# Patient Record
Sex: Female | Born: 1985 | Race: Black or African American | Hispanic: No | Marital: Single | Smoking: Never smoker
Health system: Southern US, Community
[De-identification: ages and names within clinical notes are randomized; demographics above are authoritative.]

## PROBLEM LIST (undated history)

## (undated) ENCOUNTER — Inpatient Hospital Stay (HOSPITAL_COMMUNITY): Payer: Self-pay

## (undated) DIAGNOSIS — G43909 Migraine, unspecified, not intractable, without status migrainosus: Secondary | ICD-10-CM

## (undated) DIAGNOSIS — R112 Nausea with vomiting, unspecified: Secondary | ICD-10-CM

## (undated) DIAGNOSIS — K219 Gastro-esophageal reflux disease without esophagitis: Secondary | ICD-10-CM

## (undated) DIAGNOSIS — O99119 Other diseases of the blood and blood-forming organs and certain disorders involving the immune mechanism complicating pregnancy, unspecified trimester: Secondary | ICD-10-CM

## (undated) DIAGNOSIS — D696 Thrombocytopenia, unspecified: Secondary | ICD-10-CM

## (undated) DIAGNOSIS — Z9889 Other specified postprocedural states: Secondary | ICD-10-CM

## (undated) HISTORY — DX: Other diseases of the blood and blood-forming organs and certain disorders involving the immune mechanism complicating pregnancy, unspecified trimester: O99.119

## (undated) HISTORY — PX: WISDOM TOOTH EXTRACTION: SHX21

## (undated) HISTORY — DX: Thrombocytopenia, unspecified: D69.6

## (undated) HISTORY — PX: OTHER SURGICAL HISTORY: SHX169

## (undated) HISTORY — DX: Migraine, unspecified, not intractable, without status migrainosus: G43.909

## (undated) HISTORY — DX: Other specified postprocedural states: Z98.890

## (undated) HISTORY — DX: Nausea with vomiting, unspecified: R11.2

---
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
## 2009-05-29 LAB — CONVERTED CEMR LAB: Pap Smear: NORMAL

## 2009-07-19 ENCOUNTER — Ambulatory Visit: Payer: Self-pay | Admitting: Family Medicine

## 2009-09-26 ENCOUNTER — Ambulatory Visit: Payer: Self-pay | Admitting: Family Medicine

## 2009-09-26 DIAGNOSIS — L708 Other acne: Secondary | ICD-10-CM | POA: Insufficient documentation

## 2009-09-26 DIAGNOSIS — J45909 Unspecified asthma, uncomplicated: Secondary | ICD-10-CM | POA: Insufficient documentation

## 2009-09-26 DIAGNOSIS — N760 Acute vaginitis: Secondary | ICD-10-CM | POA: Insufficient documentation

## 2009-09-26 DIAGNOSIS — G43909 Migraine, unspecified, not intractable, without status migrainosus: Secondary | ICD-10-CM | POA: Insufficient documentation

## 2009-09-26 HISTORY — DX: Other acne: L70.8

## 2009-09-26 LAB — CONVERTED CEMR LAB

## 2009-09-27 LAB — CONVERTED CEMR LAB
ALT: 12 units/L (ref 0–35)
AST: 17 units/L (ref 0–37)
Albumin: 4 g/dL (ref 3.5–5.2)
Alkaline Phosphatase: 36 units/L — ABNORMAL LOW (ref 39–117)
BUN: 14 mg/dL (ref 6–23)
Bilirubin, Direct: 0.2 mg/dL (ref 0.0–0.3)
CO2: 30 meq/L (ref 19–32)
Calcium: 9.2 mg/dL (ref 8.4–10.5)
Chloride: 108 meq/L (ref 96–112)
Cholesterol: 177 mg/dL (ref 0–200)
Creatinine, Ser: 0.9 mg/dL (ref 0.4–1.2)
GFR calc non Af Amer: 93.37 mL/min (ref >60)
Glucose, Bld: 79 mg/dL (ref 70–99)
HDL: 64.3 mg/dL (ref >39.00)
LDL Cholesterol: 102 mg/dL — ABNORMAL HIGH (ref 0–99)
Potassium: 4.2 meq/L (ref 3.5–5.1)
Sodium: 142 meq/L (ref 135–145)
Total Bilirubin: 1 mg/dL (ref 0.3–1.2)
Total CHOL/HDL Ratio: 3
Total Protein: 6.9 g/dL (ref 6.0–8.3)
Triglycerides: 55 mg/dL (ref 0.0–149.0)
VLDL: 11 mg/dL (ref 0.0–40.0)

## 2009-12-04 ENCOUNTER — Telehealth: Payer: Self-pay | Admitting: Family Medicine

## 2009-12-12 ENCOUNTER — Telehealth: Payer: Self-pay | Admitting: Family Medicine

## 2010-01-27 ENCOUNTER — Ambulatory Visit: Payer: Self-pay | Admitting: Family Medicine

## 2010-01-27 DIAGNOSIS — J029 Acute pharyngitis, unspecified: Secondary | ICD-10-CM

## 2010-05-13 NOTE — Progress Notes (Signed)
Summary: requests flagyl  Phone Note Call from Patient Call back at Home Phone [phone]   Caller: Patient Summary of Call: Pt is asking for flagyl to be called to [facility] on [address].  She say she has frequent bacterial vaginal infections and that Dr. Dayton Martes had told her she could have this called in without having to be seen. Initial call taken by: Lowella Petties CMA,  December 04, 2009 3:27 PM    Prescriptions: METROGEL-VAGINAL 0.75 % GEL (METRONIDAZOLE) 1 applicator full PV at bedtime x 5 days  #1 x 0   Entered and Authorized by:   Ruthe Mannan MD   Signed by:   Ruthe Mannan MD on 12/05/2009   Method used:   Electronically to        [facility] [phone]* (retail)       [address]       [place], [place]  [address]       Ph: [phone]       Fax: [phone]   RxID:   [id number]

## 2010-05-13 NOTE — Assessment & Plan Note (Signed)
Summary: ? MONO   Vital Signs:  Patient profile:   25 year old female Height:      76 inches Weight:      218 pounds BMI:     26.63 Temp:     98.2 degrees F oral Pulse rate:   84 / minute Pulse rhythm:   regular BP sitting:   130 / 82  (left arm) Cuff size:   regular  Vitals Entered By: Linde Gillis CMA Duncan Dull) (January 27, 2010 9:52 AM) CC: ? Mono   History of Present Illness: 25 yo here for ? mono.  Had sore throat a few days ago.  Went to Urgent care, told nothing was wrong but she wanted to make sure she didn't have mono. thought she might have mono because her stomach hurt a little, that has resolved. No fevers, chills, nausea or vomiting. No changes in stools. Throat feels better.  did have a runny nose that has resolved.  Migraines- occuring less often.  Taking NSAIDs when the occur.  Cannot tolerate Topamax.  Current Medications (verified): 1)  Imitrex 25 Mg Tabs (Sumatriptan Succinate) .Marland Kitchen.. 1 Tab By Mouth At Onset of Headache, May Repeat in 1 Hour.  Allergies: 1)  ! Pcn 2)  ! 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 ENT:  Denies difficulty swallowing. Resp:  Denies cough.  Physical Exam  General:  Well-developed,well-nourished,in no acute distress; alert,appropriate and cooperative throughout examination Mouth:  good dentition.  pharyngeal erythema and tonsil hypertropied.   Lungs:  Normal respiratory effort, chest expands symmetrically. Lungs are clear to  auscultation, no crackles or wheezes. Heart:  Normal rate and regular rhythm. S1 and S2 normal without gallop, murmur, click, rub or other extra sounds. Psych:  Cognition and judgment appear intact. Alert and cooperative with normal attention span and concentration. No apparent delusions, illusions, hallucinations   Impression & Recommendations:  Problem # 1:  SORE THROAT (ICD-462) Assessment New Reassurance provided.  Physical exam and history reassuring.  LIkely viral, continue supportive care.  Problem # 2:  MIGRAINE HEADACHE (ICD-346.90) Assessment: Unchanged Will try Imitrex for abortive therapy.  Follow up in 1-2 months. Her updated medication list for this problem includes:    Imitrex 25 Mg Tabs (Sumatriptan succinate) .Marland Kitchen... 1 tab by mouth at onset of headache, may repeat in 1 hour.  Complete Medication List: 1)  Imitrex 25 Mg Tabs (Sumatriptan succinate) .Marland Kitchen.. 1 tab by mouth at onset of headache, may repeat in 1 hour. Prescriptions: IMITREX 25 MG TABS (SUMATRIPTAN SUCCINATE) 1 tab by mouth at onset of headache, may repeat in 1 hour.  #20 x 0   Entered and Authorized by:   Ruthe Mannan MD   Signed by:   Ruthe Mannan MD on 01/27/2010   Method used:   Electronically to        [facility] [address] [phone]* (retail)       [address] [place], [place]  [address]       Ph: [phone]       Fax:  [phone]   RxID:   [id number]    Orders Added: 1)  Est. Patient Level IV [85277]    Prior Medications (reviewed today): None Current Allergies (reviewed today): ! PCN ! AMOXICILLIN

## 2010-05-13 NOTE — Assessment & Plan Note (Signed)
Summary: NEW PATIENT/[facility]   Vital Signs:  Patient profile:   25 year old female Height:      76 inches Weight:      216.38 pounds BMI:     26.43 Temp:     98.4 degrees F oral Pulse rate:   80 / minute Pulse rhythm:   regular BP sitting:   110 / 84  (left arm) Cuff size:   regular  Vitals Entered By: Linde Gillis CMA Duncan Dull) (September 26, 2009 11:03 AM) CC: new patient   History of Present Illness: 25 yo here to establish care.  ?possible allergy to condoms - has been sexually active with current partner for several months.  Has noticed that her vagina feels very irritated and inflamed after intercourse, seems to be worse with certain brands of condoms.  No using any lubricants, different soaps, or lotions.  Never has any associated SOB or wheezing.  recurrent BV- diagnosed with it at least 4 times per year for several years.  Vulvular irritation and discharge.  Usually takes by mouth flagyl.  Exercise induced asthma- has an alubeterol inhaler that she uses as needed.  Has not needed it in months but always carries it with her.  Never hospitalized for asthma.  Migraine headahces- was having 3 per month, [facility]  Placed on Topomax but she though it made her have strange thoughts and fatigue.  Discovered her triggers, such as salty food and dehyration and since then has 1 migraine every couple of months.  They are always associated with photophobia and vomiting.  Takes Tylenol for them.  Acne- has had issues with recurrent cystic acne.  Never tried anything for it.  Mainly on face and upper back.  Well woman- Sexually active with one partner.  Has Pap smears yearly at GYN.  Preventive Screening-Counseling & Management  Alcohol-Tobacco     Smoking Status: never  Caffeine-Diet-Exercise     Does Patient Exercise: yes  Current Medications (verified): 1)  Yaz 3-0.02 Mg  Tabs (Drospirenone-Ethinyl Estradiol) .Marland Kitchen.. 1 Tab By Mouth Daily 2)  Metrogel-Vaginal  0.75 % Gel (Metronidazole) .Marland Kitchen.. 1 Applicator Full Pv At Bedtime X 5 Days  Allergies (verified): 1)  ! Pcn 2)  ! 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 Eyes:  Denies blurring. ENT:  Denies difficulty swallowing. CV:  Denies chest pain or discomfort and shortness of breath with exertion. Resp:  Denies shortness of breath. GI:  Denies abdominal pain and change in bowel habits. GU:  Complains of discharge; denies dysuria. MS:  Denies joint pain, joint redness, and joint swelling. Derm:  Denies rash. Neuro:  Denies headaches. Psych:  Denies anxiety and depression. Endo:  Denies cold intolerance and heat intolerance. Heme:  Denies abnormal bruising. Allergy:  Denies seasonal allergies.  Physical Exam  General:  Well-developed,well-nourished,in no acute distress; alert,appropriate and cooperative throughout examination Head:  normocephalic, atraumatic, no abnormalities observed, and  no abnormalities palpated.   Eyes:  vision grossly intact, pupils equal, pupils round, and pupils reactive to light.   Ears:  R ear normal and L ear normal.   Nose:  no external deformity, nose piercing noted, and no external erythema.   Mouth:  good dentition.   Lungs:  Normal respiratory effort, chest expands symmetrically. Lungs are clear to auscultation, no  crackles or wheezes. Heart:  Normal rate and regular rhythm. S1 and S2 normal without gallop, murmur, click, rub or other extra sounds. Abdomen:  Bowel sounds positive,abdomen soft and non-tender without masses, organomegaly or hernias noted. Msk:  No deformity or scoliosis noted of thoracic or lumbar spine.   Extremities:  No clubbing, cyanosis, edema, or deformity noted with normal full range of motion of all joints.   Neurologic:  alert & oriented X3.   Skin:  mild cystic acne on face Psych:  Cognition and judgment appear intact. Alert and cooperative with normal attention span and concentration. No apparent delusions, illusions, hallucinations   Impression & Recommendations:  Problem # 1:  VAGINITIS, BACTERIAL, RECURRENT (ICD-616.10) Assessment New Pt did not want to have wet prep today, has had multiple recently.  Will try topical metrogel and discussed repeating wet prep and STD testing if no improvement of symptoms. Her updated medication list for this problem includes:    Metrogel-vaginal 0.75 % Gel (Metronidazole) .Marland Kitchen... 1 applicator full pv at bedtime x 5 days  Problem # 2:  MIGRAINE HEADACHE (ICD-346.90) Assessment: Unchanged Appears stable with lifestyle changes.  Problem # 3:  ? of PERSONAL HISTORY OF ALLERGY TO LATEX (ICD-V15.07) Assessment: New Discussed trying OCPs since she does trust her parter and both were tested for STDs a few months ago.  Start Yaz today, continue condoms for several weeks after starting OCPs.  Problem # 4:  SCREENING EXAMINATION FOR VENEREAL DISEASE (ICD-V74.5) Assessment: Unchanged Recheck HIV and RPR per pt request today. Orders: Venipuncture (11914) T-HIV Antibody  (Reflex) ([id number]) T-RPR (Syphilis) ([id number]) Specimen Handling (95284)  Problem # 5:  ASTHMA (ICD-493.90) Assessment: Unchanged Stable with as needed albuterol.  Problem # 6:  ACNE, MILD (ICD-706.1) Assessment: New Will hopefully improve with OCPs.  Complete  Medication List: 1)  Yaz 3-0.02 Mg Tabs (Drospirenone-ethinyl estradiol) .Marland Kitchen.. 1 tab by mouth daily 2)  Metrogel-vaginal 0.75 % Gel (Metronidazole) .Marland Kitchen.. 1 applicator full pv at bedtime x 5 days  Other Orders: TLB-Lipid Panel (80061-LIPID) TLB-BMP (Basic Metabolic Panel-BMET) (80048-METABOL) TLB-Hepatic/Liver Function Pnl (80076-HEPATIC) Prescriptions: METROGEL-VAGINAL 0.75 % GEL (METRONIDAZOLE) 1 applicator full PV at bedtime x 5 days  #1 x 0   Entered and Authorized by:   Ruthe Mannan MD   Signed by:   Ruthe Mannan MD on 09/26/2009   Method used:   Print then Give to Patient   RxID:   [id number] YAZ 3-0.02 MG  TABS (DROSPIRENONE-ETHINYL ESTRADIOL) 1 tab by mouth daily  #1 x 11   Entered and Authorized by:   Ruthe Mannan MD   Signed by:   Ruthe Mannan MD on 09/26/2009   Method used:   Print then Give to Patient   RxID:   [id number]   Prior Medications (reviewed today): None Current Allergies (reviewed today): ! PCN ! AMOXICILLIN  PAP Result Date:  05/29/2009 PAP Result:  historical-normal PAP Next Due:  1 yr Chlamydia Result Date:  09/26/2009 Chlamydia Result:  historical- normal

## 2010-05-13 NOTE — Progress Notes (Signed)
Summary: ? Medication  Phone Note Call from Patient   Caller: Patient Call For: Ruthe Mannan MD Summary of Call: Patient called and was unhappy about the gel that was sent in for her vaginal infection. Patient stated that she had requested a pill form treatment be sent in. Offered to send a message to another doctor to see if they would call something else in for her. Patient stated that she now has 2 boxes of the gel that she has already paid for and will try to use one of those. Patient just wanted to let you know that she does have these infections real often and that in the future she would like pills called in instead of gel.  Patient is aware that Dr. Dayton Martes is out until the middle of next week and wants this message left for her to see when she returns. Initial call taken by: Sydell Axon LPN,  December 12, 2009 2:04 PM  Follow-up for Phone Call        I apologize for the msundererstanding. Ruthe Mannan MD  December 13, 2009 5:22 AM

## 2010-07-17 ENCOUNTER — Encounter: Payer: Self-pay | Admitting: Family Medicine

## 2010-07-17 LAB — HM PAP SMEAR

## 2010-07-21 ENCOUNTER — Ambulatory Visit (INDEPENDENT_AMBULATORY_CARE_PROVIDER_SITE_OTHER): Payer: 59 | Admitting: Family Medicine

## 2010-07-21 VITALS — BP 120/80 | HR 81 | Temp 98.2°F | Wt 220.0 lb

## 2010-07-21 DIAGNOSIS — N760 Acute vaginitis: Secondary | ICD-10-CM

## 2010-07-21 DIAGNOSIS — Z309 Encounter for contraceptive management, unspecified: Secondary | ICD-10-CM

## 2010-07-21 LAB — POCT URINE PREGNANCY: Preg Test, Ur: NEGATIVE

## 2010-07-21 MED ORDER — DROSPIRENONE-ETHINYL ESTRADIOL 3-0.02 MG PO TABS: 1.0000 | ORAL_TABLET | Freq: Every day | ORAL | Status: DC

## 2010-07-21 MED ORDER — SUMATRIPTAN SUCCINATE 25 MG PO TABS: 25.0000 mg | ORAL_TABLET | ORAL | Status: DC | PRN

## 2010-07-21 MED ORDER — METRONIDAZOLE 500 MG PO TABS: 500.0000 mg | ORAL_TABLET | Freq: Two times a day (BID) | ORAL | Status: AC

## 2010-07-21 NOTE — Progress Notes (Signed)
25 yo here to discuss birth control.  ?possible allergy to condoms - has been sexually active with current partner for several months.  Has noticed that her vagina feels very irritated and inflamed after intercourse, seems to be worse with certain brands of condoms.  No using any lubricants, different soaps, or lotions.  Never has any associated SOB or wheezing.  Has previously used Nuvaring, did not like how labor intensive it was.  recurrent BV- diagnosed with it at least 4 times per year for several years.  Vulvular irritation and discharge.  Usually takes by mouth flagyl.  The PMH, PSH, Social History, Family History, Medications, and allergies have been reviewed in [facility], and have been updated if relevant.   Review of Systems       See HPI General:  Denies fever and malaise. Eyes:  Denies blurring. ENT:  Denies difficulty swallowing. CV:  Denies chest pain or discomfort and shortness of breath with exertion. Resp:  Denies shortness of breath. GI:  Denies abdominal pain and change in bowel habits. GU:  Complains of discharge; denies dysuria. MS:  Denies joint pain, joint redness, and joint swelling. Derm:  Denies rash. Neuro:  Denies headaches. Psych:  Denies anxiety and depression. Endo:  Denies cold intolerance and heat intolerance. Heme:  Denies abnormal bruising. Allergy:  Denies seasonal allergies.  Physical Exam BP 120/80  Pulse 81  Temp 98.2 F (36.8 C)  Wt 220 lb (99.791 kg)   General:  Well-developed,well-nourished,in no acute distress; alert,appropriate and cooperative throughout examination Head:  normocephalic and atraumatic.   Eyes:  vision grossly intact, pupils equal, pupils round, and pupils reactive to light.   Lungs:  Normal respiratory effort, chest expands symmetrically. Lungs are clear to auscultation, no crackles or wheezes. Heart:  Normal rate and regular rhythm. S1 and S2 normal without gallop, murmur, click, rub or other extra sounds. Abdomen:  Bowel  sounds positive,abdomen soft and non-tender without masses, organomegaly or hernias noted. Rectal:  no external abnormalities.   Genitalia:  Pelvic Exam:        External: normal female genitalia without lesions or masses        Vagina: normal without lesions or masses        Cervix: normal without lesions or masses        Adnexa: normal bimanual exam without masses or fullness        Uterus: normal by palpation        Pap smear: performed Extremities:  No clubbing, cyanosis, edema, or deformity noted with normal full range of motion of all joints.   Neurologic:  alert & oriented X3 and gait normal.   Psych:  Cognition and judgment appear intact. Alert and cooperative with normal attention span and concentration. No apparent delusions, illusions, hallucinations

## 2010-07-21 NOTE — Assessment & Plan Note (Signed)
Deteriorated.  Wet prep positive with scattered clue cells, otherwise neg. Rx given for Flagyl.

## 2010-11-26 ENCOUNTER — Telehealth: Payer: Self-pay | Admitting: *Deleted

## 2010-11-26 MED ORDER — METRONIDAZOLE 500 MG PO TABS: 500.0000 mg | ORAL_TABLET | Freq: Two times a day (BID) | ORAL | Status: AC

## 2010-11-26 NOTE — Telephone Encounter (Signed)
Rx for flagyl sent.

## 2010-11-26 NOTE — Telephone Encounter (Signed)
Pt states you told her to call if she had anymore problems with BV, if she thought she had a chronic condition, and she says she does.  She is asking that something be called to [facility].

## 2011-04-14 NOTE — L&D Delivery Note (Signed)
Delivery Note At 1:04 AM a viable female was delivered via Vaginal, Spontaneous Delivery (Presentation: Right Occiput Anterior).  APGAR: 7, 8; weight 3671gms .   Placenta status: intact , .  Cord: 3 vessels with the following complications: None.  Cord pH: none  Anesthesia: Epidural  Episiotomy: none Lacerations: 2nd degree Suture Repair: 2.0 chromic Est. Blood Loss (mL): 350  Mom to postpartum.  Baby to nursery-stable.  HARPER,CHARLES A 12/16/2011, 1:38 AM

## 2011-05-05 ENCOUNTER — Other Ambulatory Visit: Payer: Self-pay

## 2011-05-05 LAB — OB RESULTS CONSOLE HEPATITIS B SURFACE ANTIGEN: Hepatitis B Surface Ag: NEGATIVE

## 2011-05-05 LAB — OB RESULTS CONSOLE RUBELLA ANTIBODY, IGM: Rubella: IMMUNE

## 2011-06-18 ENCOUNTER — Inpatient Hospital Stay (HOSPITAL_COMMUNITY)
Admission: AD | Admit: 2011-06-18 | Discharge: 2011-06-18 | Disposition: A | Discharge status: Home / Self Care | Payer: 59 | Source: Ambulatory Visit | Attending: Obstetrics | Admitting: Obstetrics

## 2011-06-18 ENCOUNTER — Encounter (HOSPITAL_COMMUNITY): Payer: Self-pay

## 2011-06-18 DIAGNOSIS — O99891 Other specified diseases and conditions complicating pregnancy: Secondary | ICD-10-CM | POA: Insufficient documentation

## 2011-06-18 DIAGNOSIS — R059 Cough, unspecified: Secondary | ICD-10-CM | POA: Insufficient documentation

## 2011-06-18 DIAGNOSIS — R05 Cough: Secondary | ICD-10-CM | POA: Insufficient documentation

## 2011-06-18 DIAGNOSIS — Z331 Pregnant state, incidental: Secondary | ICD-10-CM

## 2011-06-18 DIAGNOSIS — J45909 Unspecified asthma, uncomplicated: Secondary | ICD-10-CM | POA: Insufficient documentation

## 2011-06-18 HISTORY — DX: Migraine, unspecified, not intractable, without status migrainosus: G43.909

## 2011-06-18 MED ORDER — AZITHROMYCIN 250 MG PO TABS: ORAL_TABLET | ORAL | Status: AC

## 2011-06-18 MED ORDER — ALBUTEROL SULFATE (5 MG/ML) 0.5% IN NEBU
2.5000 mg | INHALATION_SOLUTION | Freq: Once | RESPIRATORY_TRACT | Status: AC
  Administered 2011-06-18: 2.5 mg via RESPIRATORY_TRACT
  Filled 2011-06-18: qty 0.5

## 2011-06-18 MED ORDER — GUAIFENESIN-CODEINE 100-10 MG/5ML PO SYRP: 5.0000 mL | ORAL_SOLUTION | Freq: Three times a day (TID) | ORAL | Status: AC | PRN

## 2011-06-18 MED ORDER — IPRATROPIUM BROMIDE 0.02 % IN SOLN
0.5000 mg | Freq: Once | RESPIRATORY_TRACT | Status: AC
  Administered 2011-06-18: 0.5 mg via RESPIRATORY_TRACT
  Filled 2011-06-18: qty 2.5

## 2011-06-18 MED ORDER — PREDNISONE (PAK) 10 MG PO TABS: 10.0000 mg | ORAL_TABLET | Freq: Every day | ORAL | Status: AC

## 2011-06-18 NOTE — Progress Notes (Signed)
Patient states she had a cold about 1 1/2 months ago. Was given a Z-pack but developed a cough that gets so bad that it causes her to vomit. She has a history of asthma and uses her inhaler with temporary relief. No pregnancy complications.

## 2011-06-18 NOTE — ED Provider Notes (Signed)
History     CSN: [id number]  Arrival date & time 06/18/11  1212   None     Chief Complaint  Patient presents with  . Cough    HPI Erica Wyatt is a 26 y.o. female @ [redacted]w[redacted]d gestation who presents to [facility] for cough and wheezing for over a week. Using inhaler without relief.   Past Medical History  Diagnosis Date  . Asthma     exercise inducted  . Migraines   . Migraine headache     Past Surgical History  Procedure Date  . Wisdom tooth extraction     Family History  Problem Relation Age of Onset  . Hypertension Mother   . Diabetes Father   . Anesthesia problems Neg Hx   . Hypotension Neg Hx   . Malignant hyperthermia Neg Hx   . Pseudochol deficiency Neg Hx     History  Substance Use Topics  . Smoking status: Never Smoker   . Smokeless tobacco: Never Used  . Alcohol Use: No    OB History    Grav Para Term Preterm Abortions TAB SAB Ect Mult Living   1               Review of Systems  Constitutional: Positive for appetite change and fatigue. Negative for fever, chills and diaphoresis.  HENT: Positive for congestion. Negative for ear pain, sore throat, facial swelling, neck pain, neck stiffness, dental problem and sinus pressure.   Eyes: Negative for photophobia, pain and discharge.  Respiratory: Positive for cough, shortness of breath and wheezing. Negative for chest tightness.   Cardiovascular: Negative.   Gastrointestinal: Positive for vomiting (with cough). Negative for nausea, abdominal pain, diarrhea, constipation and abdominal distention.  Genitourinary: Positive for frequency. Negative for dysuria, urgency, flank pain, vaginal bleeding, vaginal discharge and difficulty urinating.  Musculoskeletal: Negative for myalgias, back pain and gait problem.  Skin: Negative for color change and rash.  Neurological: Positive for headaches. Negative for dizziness, speech difficulty, weakness, light-headedness and numbness.  Psychiatric/Behavioral: Negative for  confusion and agitation. The patient is not nervous/anxious.     Allergies  Amoxicillin and Penicillins  Home Medications  No current outpatient prescriptions on file.  Pulse 112  Temp(Src) 98.6 F (37 C) (Oral)  Resp 20  SpO2 98%  LMP 03/04/2011  Physical Exam  Nursing note and vitals reviewed. Constitutional: She is oriented to person, place, and time. She appears well-developed and well-nourished.  HENT:  Head: Normocephalic.  Eyes: EOM are normal.  Neck: Neck supple.  Cardiovascular:       tachycardia  Pulmonary/Chest: Accessory muscle usage present.       Prolonged expirations, decreased air movement bilateral lower lung fields. Occasional wheezing.  Abdominal: Soft. There is no tenderness.       + FHT  Musculoskeletal: Normal range of motion.  Neurological: She is alert and oriented to person, place, and time. No cranial nerve deficit.  Skin: Skin is warm and dry.  Psychiatric: She has a normal mood and affect. Her behavior is normal. Judgment and thought content normal.   Assessment: Asthmatic bronchitis  Plan:  Albuterol/atrovent neb treatment     Re evaluation: After treatment patient feeling much better but feeling really tired Re examination: Lung sounds have improved   ED Course: Discussed with Dr. Clearance Coots will treat with prednisone and z-pak  Procedures  Plan Continued: Z-Pak Rx    Robitussin AC Rx    Prednisone Rx    Follow up in the office  MDM          Kerrie Buffalo, NP 06/18/11 1418

## 2011-06-18 NOTE — Discharge Instructions (Signed)
Asthma, Acute Bronchospasm  Your exam shows you have asthma, or acute bronchospasm that acts like asthma. Bronchospasm means your air passages become narrowed. These conditions are due to inflammation and airway spasm that cause narrowing of the bronchial tubes in the lungs. This causes you to have wheezing and shortness of breath.  CAUSES    Respiratory infections and allergies most often bring on these attacks. Smoking, air pollution, cold air, emotional upsets, and vigorous exercise can also bring them on.    TREATMENT     Treatment is aimed at making the narrowed airways larger. Mild asthma/bronchospasm is usually controlled with inhaled medicines. Albuterol is a common medicine that you breathe in to open spastic or narrowed airways. Some trade names for albuterol are Ventolin or Proventil. Steroid medicine is also used to reduce the inflammation when an attack is moderate or severe. Antibiotics (medications used to kill germs) are only used if a bacterial infection is present.    If you are pregnant and need to use Albuterol (Ventolin or Proventil), you can expect the baby to move more than usual shortly after the medicine is used.   HOME CARE INSTRUCTIONS     Rest.    Drink plenty of liquids. This helps the mucus to remain thin and easily coughed up. Do not use caffeine or alcohol.    Do not smoke. Avoid being exposed to second-hand smoke.    You play a critical role in keeping yourself in good health. Avoid exposure to things that cause you to wheeze. Avoid exposure to things that cause you to have breathing problems. Keep your medications up-to-date and available. Carefully follow your doctor's treatment plan.    When pollen or pollution is bad, keep windows closed and use an air conditioner go to places with air conditioning. If you are allergic to furry pets or birds, find new homes for them or keep them outside.    Take your medicine exactly as prescribed.     Asthma requires careful medical attention. See your caregiver for follow-up as advised. If you are more than [redacted] weeks pregnant and you were prescribed any new medications, let your Obstetrician know about the visit and how you are doing. Arrange a recheck.   SEEK IMMEDIATE MEDICAL CARE IF:     You are getting worse.    You have trouble breathing. If severe, call 911.    You develop chest pain or discomfort.    You are throwing up or not drinking fluids.    You are not getting better within 24 hours.    You are coughing up yellow, green, brown, or bloody sputum.    You develop a fever over 102 F (38.9 C).    You have trouble swallowing.   MAKE SURE YOU:     Understand these instructions.    Will watch your condition.    Will get help right away if you are not doing well or get worse.   Document Released: 07/15/2006 Document Revised: 03/19/2011 Document Reviewed: 03/14/2007  [employer] Patient Information 2012 [employer].

## 2011-06-18 NOTE — Progress Notes (Signed)
Pt states has had cough and congestion, denies fever. Cough x1.5 months. Denies vaginal d/c changes or bleeding. Has dry cough, usually non-productive. Will cough until she gags and vomits.

## 2011-07-01 ENCOUNTER — Ambulatory Visit (INDEPENDENT_AMBULATORY_CARE_PROVIDER_SITE_OTHER): Payer: 59 | Admitting: Internal Medicine

## 2011-07-01 ENCOUNTER — Encounter: Payer: Self-pay | Admitting: Internal Medicine

## 2011-07-01 VITALS — BP 130/78 | HR 86 | Temp 98.2°F | Ht 76.0 in | Wt 254.8 lb

## 2011-07-01 DIAGNOSIS — R05 Cough: Secondary | ICD-10-CM

## 2011-07-01 DIAGNOSIS — R059 Cough, unspecified: Secondary | ICD-10-CM

## 2011-07-01 NOTE — Progress Notes (Signed)
Subjective:    Patient ID: Erica Wyatt, female    DOB: 18-Aug-1985, 26 y.o.   MRN: [id number]  HPI PCP is Ruthe Mannan, MD, MD   IOV 07/01/2011 26 year old female. Body mass index is 31.02 kg/(m^2).  reports that she has never smoked. She has never used smokeless tobacco. 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 At baseline has had asthma since childhood with only 1 er visit at age 85. Asthma triggered by exercise, viral URI and season change esp fall and winter and spring with main symptoms of dyspnea and chest tightness and with periodic wheeze and cough. Typically gets ae asthma in fall-winter, winter-spring due to URI at which time she will have cough as well. Always Rx only with albuterol prn. AT baseline other than that uses albuterol prn during basketball season first month to get conditioned.   Also long standing gerd, worse past 2  Years with pasta and other foods  Currently pregnant x 17 weeks. Describes cough x 2 months. Followed a URI. Still presists despite URI resolution. Gerd increased after pregnancy. Cough is moderate. Albuterol helps. Made worse by gerd. Trial of prednisone and zpak recently did not help. Has some mild dyspnea but denies wheeze. Spirometry today is normal. FEv1 3.29L/89%. RSI cough score is 30 and c/w LPR cough - Level 5 heartburn, annoying cough, and cough after lying down. Level 4 - clearing of throat, excess mucus in throat, LEvel 3 - choking episodes, and sensaiton of lump in throat and LEvel 1 - hoarseness       Past Medical History  Diagnosis Date  . Asthma     exercise inducted  . Migraines   . Migraine headache      Family History  Problem Relation Age of Onset  . Hypertension Mother   . Diabetes Father   . Anesthesia problems Neg Hx   . Hypotension Neg Hx   . Malignant hyperthermia Neg Hx   . Pseudochol deficiency Neg Hx      History   Social History  . Marital Status: Single    Spouse Name: N/A    Number of Children: N/A  . Years of  Education: N/A   Occupational History  . Police Officer [employer]   Social History Main Topics  . Smoking status: Never Smoker   . Smokeless tobacco: Never Used  . Alcohol Use: No  . Drug Use: No  . Sexually Active: Yes    Birth Control/ Protection: None   Other Topics Concern  . Not on file   Social History Narrative  . No narrative on file     Allergies  Allergen Reactions  . Amoxicillin     REACTION: childhood reaction,swelling  . Penicillins     REACTION: childhood reaction,swelling     Outpatient Prescriptions Prior to Visit  Medication Sig Dispense Refill  . acetaminophen (TYLENOL) 500 MG tablet Take 500 mg by mouth every 6 (six) hours as needed. pain           Review of Systems  Constitutional: Negative for fever and unexpected weight change.  HENT: Negative for ear pain, nosebleeds, congestion, sore throat, rhinorrhea, sneezing, trouble swallowing, dental problem, postnasal drip and sinus pressure.   Eyes: Negative for redness and itching.  Respiratory: Positive for cough, shortness of breath and wheezing. Negative for chest tightness.   Cardiovascular: Negative for palpitations and leg swelling.  Gastrointestinal: Negative for nausea and vomiting.  Genitourinary: Negative for dysuria.  Musculoskeletal: Negative for joint swelling.  Skin: Negative for rash.  Neurological: Negative for headaches.  Hematological: Does not bruise/bleed easily.  Psychiatric/Behavioral: Negative for dysphoric mood. The patient is not nervous/anxious.        Objective:   Physical Exam  Vitals reviewed. Constitutional: She is oriented to person, place, and time. She appears well-developed and well-nourished. No distress.       Tall Body mass index is 31.02 kg/(m^2).   HENT:  Head: Normocephalic and atraumatic.  Right Ear: External ear normal.  Left Ear: External ear normal.  Mouth/Throat: Oropharynx is clear and moist. No oropharyngeal exudate.  Eyes:  Conjunctivae and EOM are normal. Pupils are equal, round, and reactive to light. Right eye exhibits no discharge. Left eye exhibits no discharge. No scleral icterus.  Neck: Normal range of motion. Neck supple. No JVD present. No tracheal deviation present. No thyromegaly present.       Post nasal drainage on uvula +  Cardiovascular: Normal rate, regular rhythm, normal heart sounds and intact distal pulses.  Exam reveals no gallop and no friction rub.   No murmur heard. Pulmonary/Chest: Effort normal and breath sounds normal. No respiratory distress. She has no wheezes. She has no rales. She exhibits no tenderness.  Abdominal: Soft. Bowel sounds are normal. She exhibits no distension and no mass. There is no tenderness. There is no rebound and no guarding.  Musculoskeletal: Normal range of motion. She exhibits no edema and no tenderness.  Lymphadenopathy:    She has no cervical adenopathy.  Neurological: She is alert and oriented to person, place, and time. She has normal reflexes. No cranial nerve deficit. She exhibits normal muscle tone. Coordination normal.  Skin: Skin is warm and dry. No rash noted. She is not diaphoretic. No erythema. No pallor.  Psychiatric: She has a normal mood and affect. Her behavior is normal. Judgment and thought content normal.          Assessment & Plan:

## 2011-07-01 NOTE — Assessment & Plan Note (Signed)
Cough is from sinus drainage, acid reflux, asthma, All of this is working together to cause cyclical cough/LPR cough  #Sinus drainage  - start/continue netti pot daily (nurse will show you picture)  # Acid Reflux  - you can ttake 1-2 tablets of tums otc per day  - after you clear with OBGYN you can take rantidine 150mg table one at night (category B and considered ok in pregnancy)   - take diet sheet from us - avoid colas, spices, cheeses, spirits, red meats, beer, chocolates, fried foods etc.,   - sleep with head end of bed elevated  - eat small frequent meals  - do not go to bed for 3 hours after last meal  #Possible Asthma  - take pulmicort 90 mcg, 2 puff twice daily (ok in pregnancy) , take sample show technique  #Cyclical cough  - please choose 2-3 days and observe complete voice rest - no talking or whispering  - at all times there  there is urge to cough, drink water or swallow or sip on throat lozenge  #Followup - I will see you in 4-6 weeks.  - any problems call or come sooner - at followup do spirometry  

## 2011-07-01 NOTE — Patient Instructions (Addendum)
Cough is from sinus drainage, acid reflux, asthma, All of this is working together to cause cyclical cough/LPR cough  #Sinus drainage  - start/continue netti pot daily (nurse will show you picture)  # Acid Reflux  - you can ttake 1-2 tablets of tums otc per day  - after you clear with OBGYN you can take rantidine 150mg  table one at night (category B and considered ok in pregnancy)   - take diet sheet from [place] - avoid colas, spices, cheeses, spirits, red meats, beer, chocolates, fried foods etc.,   - sleep with head end of bed elevated  - eat small frequent meals  - do not go to bed for 3 hours after last meal  #Possible Asthma  - take pulmicort 90 mcg, 2 puff twice daily (ok in pregnancy) , take sample show technique  #Cyclical cough  - please choose 2-3 days and observe complete voice rest - no talking or whispering  - at all times there  there is urge to cough, drink water or swallow or sip on throat lozenge  #Followup - I will see you in 4-6 weeks.  - any problems call or come sooner - at followup do spirometry

## 2011-08-12 ENCOUNTER — Ambulatory Visit: Payer: 59 | Admitting: Internal Medicine

## 2011-09-16 ENCOUNTER — Other Ambulatory Visit: Payer: Self-pay | Admitting: Obstetrics

## 2011-09-16 DIAGNOSIS — O99119 Other diseases of the blood and blood-forming organs and certain disorders involving the immune mechanism complicating pregnancy, unspecified trimester: Secondary | ICD-10-CM

## 2011-09-25 ENCOUNTER — Ambulatory Visit (HOSPITAL_COMMUNITY): Payer: 59

## 2011-10-06 ENCOUNTER — Ambulatory Visit (HOSPITAL_COMMUNITY)
Admission: RE | Admit: 2011-10-06 | Discharge: 2011-10-06 | Disposition: A | Discharge status: Home / Self Care | Payer: 59 | Source: Ambulatory Visit | Attending: Obstetrics | Admitting: Obstetrics

## 2011-10-06 DIAGNOSIS — Z1389 Encounter for screening for other disorder: Secondary | ICD-10-CM | POA: Insufficient documentation

## 2011-10-06 DIAGNOSIS — O99119 Other diseases of the blood and blood-forming organs and certain disorders involving the immune mechanism complicating pregnancy, unspecified trimester: Secondary | ICD-10-CM | POA: Insufficient documentation

## 2011-10-06 DIAGNOSIS — Z363 Encounter for antenatal screening for malformations: Secondary | ICD-10-CM | POA: Insufficient documentation

## 2011-10-06 DIAGNOSIS — D696 Thrombocytopenia, unspecified: Secondary | ICD-10-CM | POA: Insufficient documentation

## 2011-10-06 DIAGNOSIS — D689 Coagulation defect, unspecified: Secondary | ICD-10-CM | POA: Insufficient documentation

## 2011-10-06 DIAGNOSIS — O358XX0 Maternal care for other (suspected) fetal abnormality and damage, not applicable or unspecified: Secondary | ICD-10-CM | POA: Insufficient documentation

## 2011-10-06 LAB — PLATELET COUNT: Platelets: 127 10*3/uL — ABNORMAL LOW (ref 150–400)

## 2011-10-06 IMAGING — US US OB DETAIL+14 WK
1 series · 12 of 28 positions shown · non-contrast
Comparison: none

[Series 1: us ob detail+14 wk · 0.21mm/px · 12 of 60 slices shown]
[im 3/60]
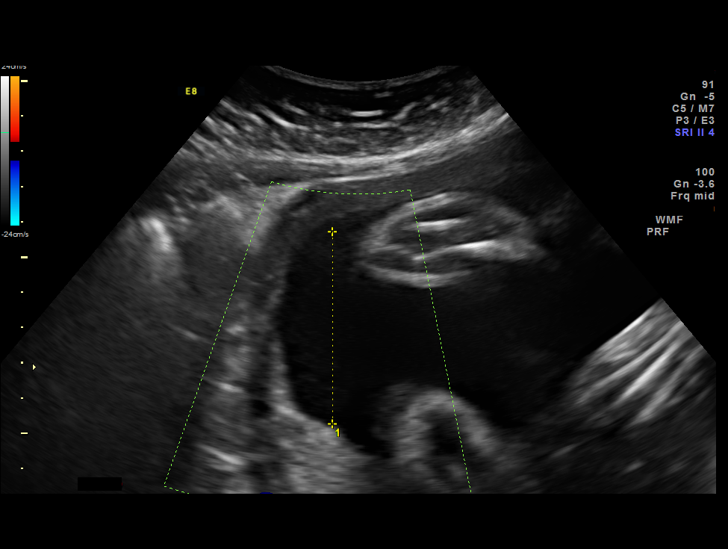
[im 7/60]
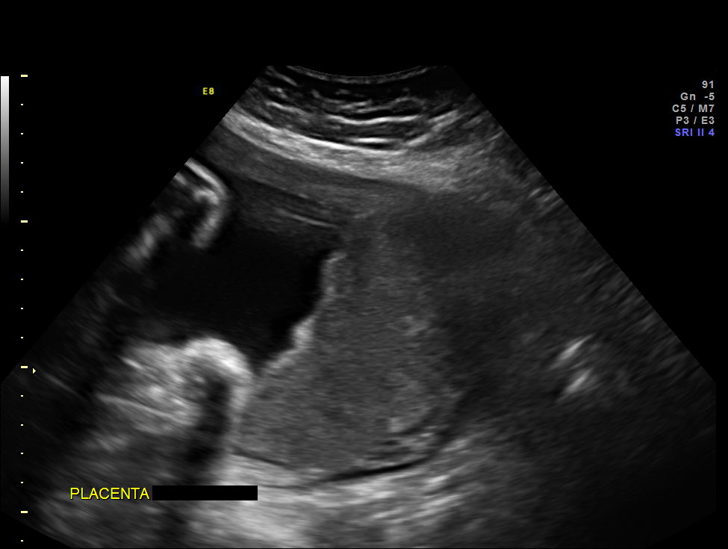
[im 11/60]
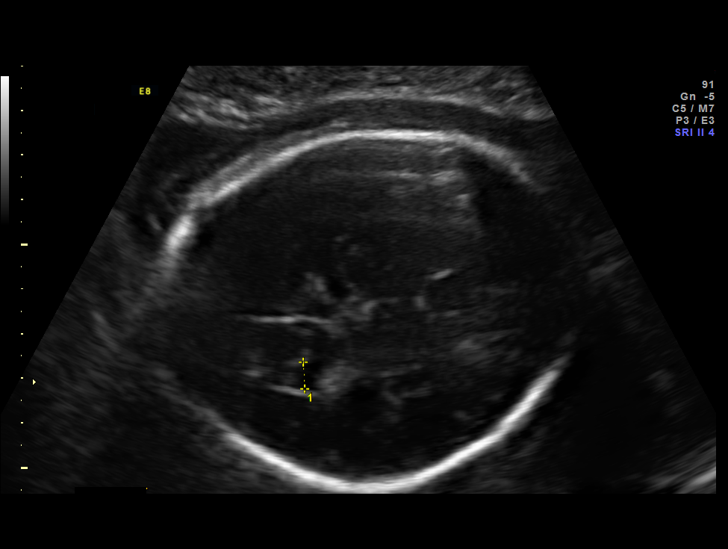
[im 18/60]
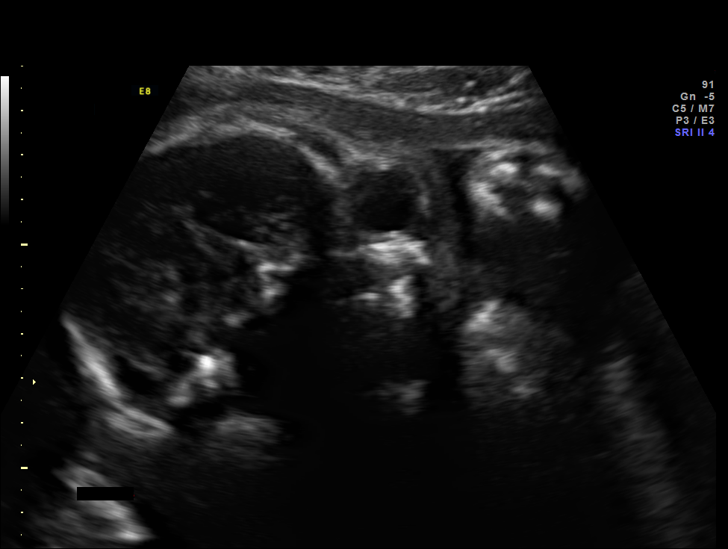
[im 22/60]
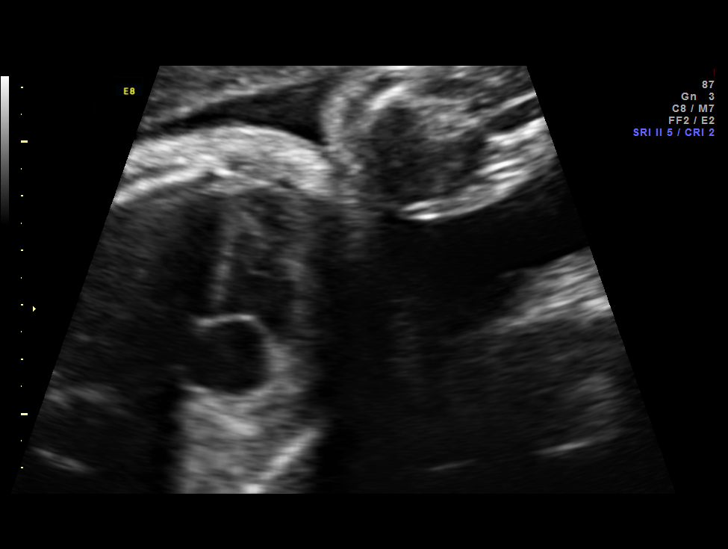
[im 27/60]
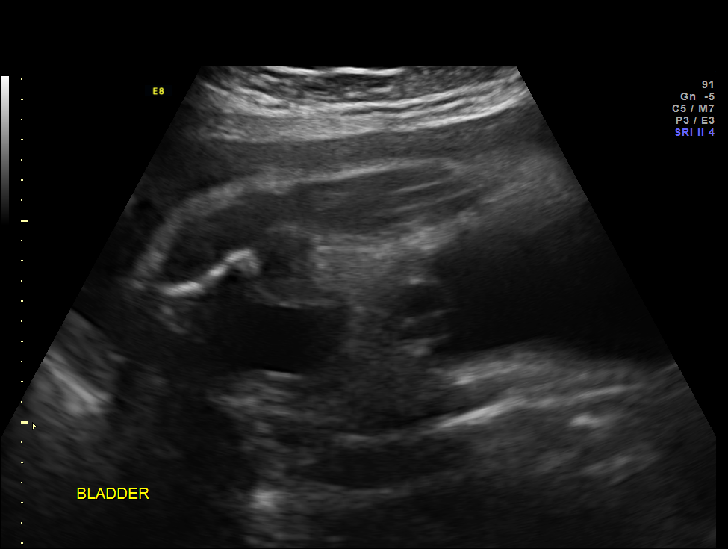
[im 33/60]
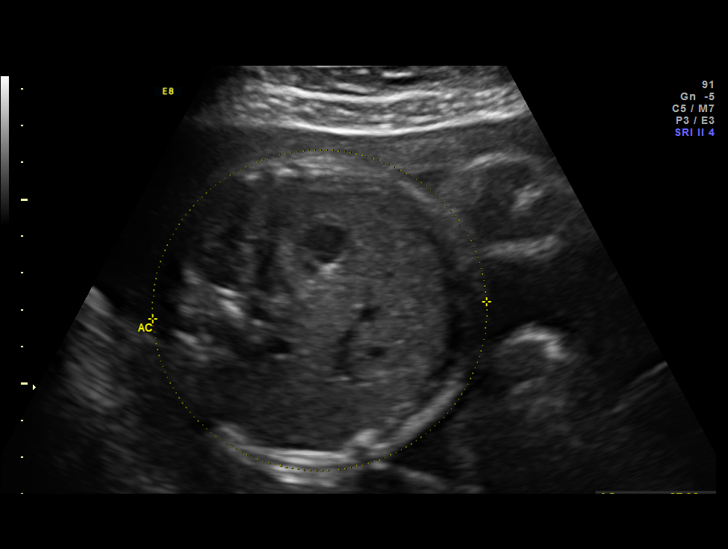
[im 38/60]
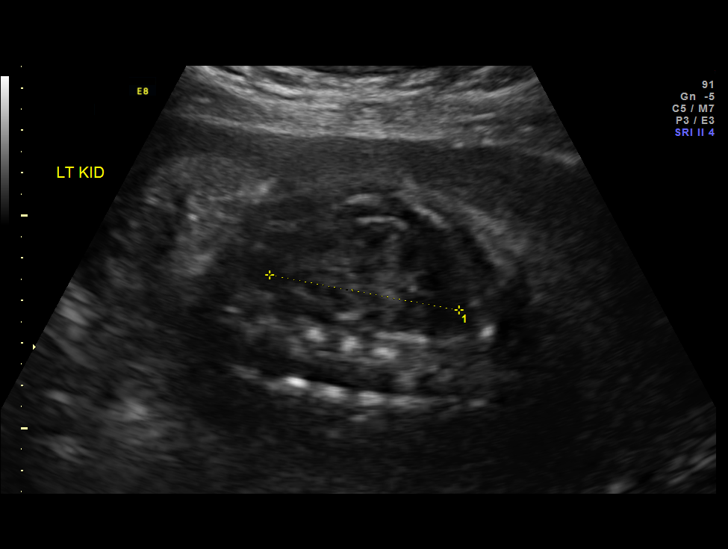
[im 42/60]
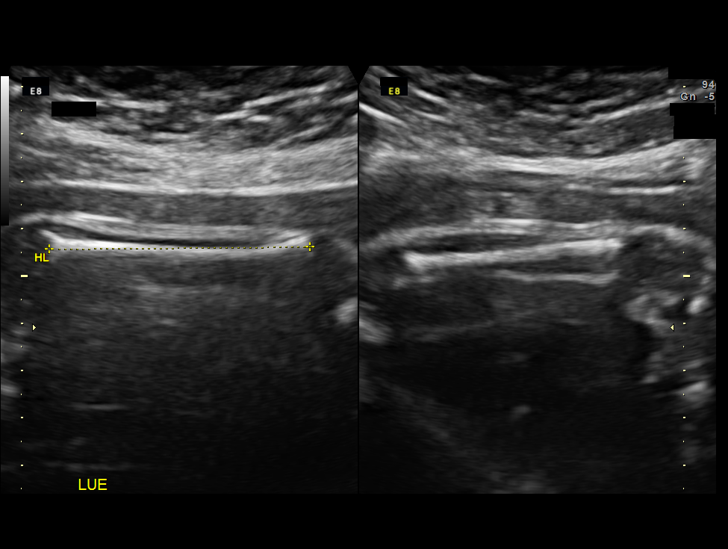
[im 49/60]
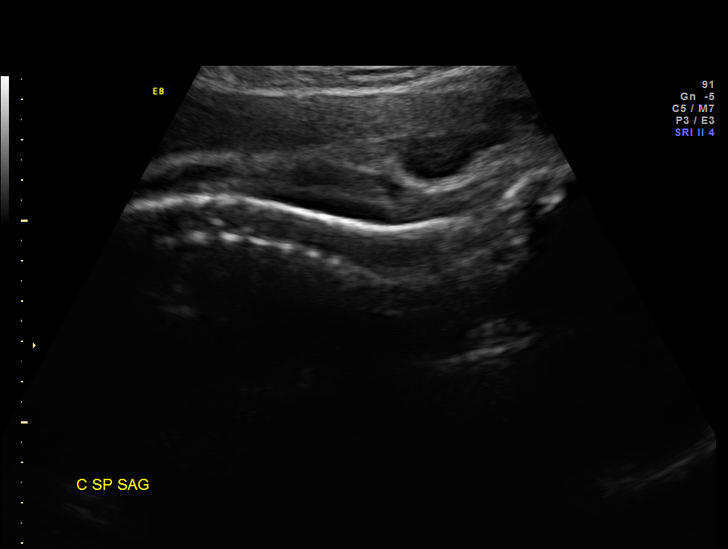
[im 53/60]
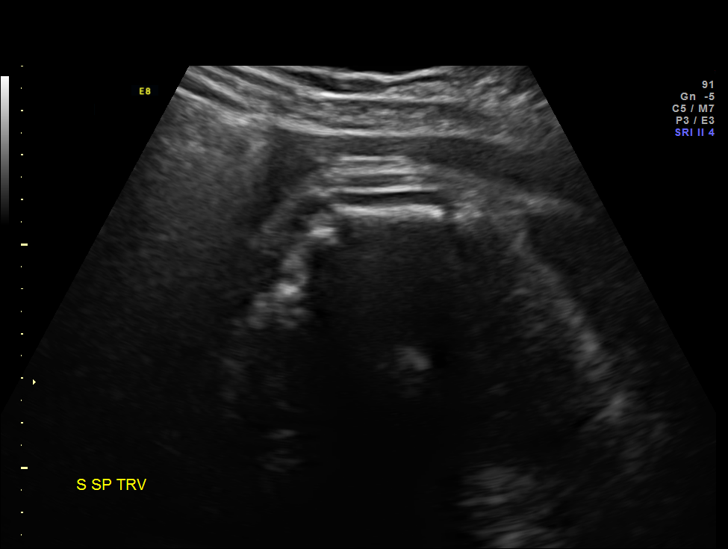
[im 57/60]
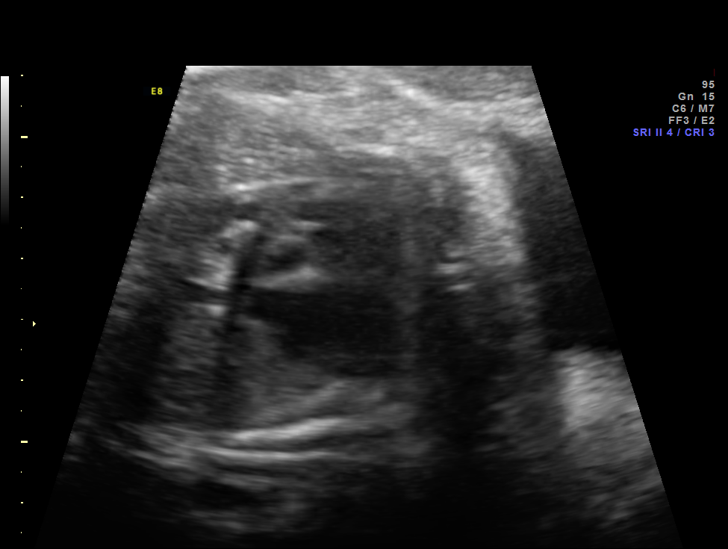

[12 of 28 positions shown; findings below may reference images not displayed]

OBSTETRICS REPORT
                      (Signed Final [DATE] [DATE])

 Order#:         [PHONE_NUMBER]_O
Procedures

 US OB DETAIL + 14 WK                                  76811.0
Indications

 Detailed fetal anatomic survey                        655.83 [NT]
 Thrombocytopenia
Fetal Evaluation

 Fetal Heart Rate:  132                          bpm
 Cardiac Activity:  Observed
 Presentation:      Cephalic
 Placenta:          Left lateral
 P. Cord            Not well visualized
 Insertion:

 Amniotic Fluid
 AFI FV:      Subjectively within normal limits
                                             Larg Pckt:     5.4  cm
Biometry

 BPD:     76.6  mm     G. Age:  30w 5d                CI:         76.1   70 - 86
 OFD:    100.7  mm                                    FL/HC:      21.8   19.3 -

 HC:     283.8  mm     G. Age:  31w 1d       22  %    HC/AC:      1.03   0.96 -

 AC:     276.5  mm     G. Age:  31w 5d       71  %    FL/BPD:     80.9   71 - 87
 FL:        62  mm     G. Age:  32w 1d       71  %    FL/AC:      22.4   20 - 24
 HUM:     55.1  mm     G. Age:  32w 0d       76  %
 CER:     37.8  mm     G. Age:  32w 3d       72  %

 Est. FW:    [NT]  gm           4 lb     71  %
Gestational Age

 LMP:           30w 6d        Date:  [DATE]                 EDD:   [DATE]
 U/S Today:     31w 3d                                        EDD:   [DATE]
 Best:          30w 6d     Det. By:  LMP  ([DATE])          EDD:   [DATE]
Anatomy
 Cranium:           Appears normal      Aortic Arch:       Appears normal
 Fetal Cavum:       Appears normal      Ductal Arch:       Not well
                                                           visualized
 Ventricles:        Appears normal      Diaphragm:         Appears normal
 Choroid Plexus:    Appears normal      Stomach:           Appears normal
 Cerebellum:        Appears normal      Abdomen:           Appears normal
 Posterior Fossa:   Appears normal      Abdominal Wall:    Appears nml
                                                           (cord insert,
                                                           abd wall)
 Nuchal Fold:       Not applicable      Cord Vessels:      Appears normal
                    (>20 wks GA)                           (3 vessel cord)
 Face:              Appears normal      Kidneys:           Appear normal
                    (lips/profile/orbit
                    s)
 Heart:             Appears normal      Bladder:           Appears normal
                    (4 chamber &
                    axis)
 RVOT:              Appears normal      Spine:             Limited views
                                                           appear normal
 LVOT:              Appears normal      Limbs:             Four extremities
                                                           seen

 Other:     Fetus appears to be a male. Technically difficult due to
            advanced GA and fetal position.
Targeted Anatomy

 Fetal Central Nervous System
 Cisterna Magna:
Cervix Uterus Adnexa

 Cervix:       Not visualized (advanced GA >29 wks)

 Left Ovary:    Within normal limits.
 Right Ovary:   Within normal limits.
 Adnexa:     No abnormality visualized.
Impression

 IUP at 30+6 weeks
 Normal detailed fetal anatomy
 Normal amniotic fluid volume
 Measurements consistent with LMP dating: EFW at the 71st
 %tile
Recommendations

 Recommend follow-up ultrasound examination

 questions or concerns.

## 2011-10-25 ENCOUNTER — Encounter (HOSPITAL_COMMUNITY): Payer: Self-pay | Admitting: *Deleted

## 2011-10-25 ENCOUNTER — Inpatient Hospital Stay (HOSPITAL_COMMUNITY)
Admission: AD | Admit: 2011-10-25 | Discharge: 2011-10-25 | Disposition: A | Discharge status: Hospice | Payer: 59 | Source: Ambulatory Visit | Attending: Obstetrics & Gynecology | Admitting: Obstetrics & Gynecology

## 2011-10-25 DIAGNOSIS — R109 Unspecified abdominal pain: Secondary | ICD-10-CM | POA: Insufficient documentation

## 2011-10-25 DIAGNOSIS — O99891 Other specified diseases and conditions complicating pregnancy: Secondary | ICD-10-CM | POA: Insufficient documentation

## 2011-10-25 DIAGNOSIS — O4700 False labor before 37 completed weeks of gestation, unspecified trimester: Secondary | ICD-10-CM

## 2011-10-25 HISTORY — DX: Gastro-esophageal reflux disease without esophagitis: K21.9

## 2011-10-25 LAB — URINALYSIS, ROUTINE W REFLEX MICROSCOPIC
Nitrite: NEGATIVE
Specific Gravity, Urine: 1.015 (ref 1.005–1.030)
Urobilinogen, UA: 0.2 mg/dL (ref 0.0–1.0)

## 2011-10-25 LAB — URINE MICROSCOPIC-ADD ON

## 2011-10-25 NOTE — Discharge Instructions (Signed)

## 2011-10-25 NOTE — MAU Note (Signed)
Pt reports yesterday she felt like the baby dropped and she has been having pain tightening  ,and cramping on and off since.  Reports good fetal movement denies vaginal  bleeding or discharge.

## 2011-10-25 NOTE — MAU Provider Note (Signed)
History     CSN: [id number]  Arrival date and time: 10/25/11 1549   First Provider Initiated Contact with Patient 10/25/11 1631      Chief Complaint  Patient presents with  . Abdominal Cramping   HPI Erica Wyatt is 26 y.o. G1P0000 [redacted]w[redacted]d weeks presenting with report of increased cramping in the lower abdomen.  States it felt lilke "he dropped" yesterday.   Denies leaking of fluid or vaginal bleeding.  + fetal movement.  Called the office and Irna instructed her to come here.     Past Medical History  Diagnosis Date  . Asthma     exercise inducted  . Migraines   . Migraine headache   . GERD (gastroesophageal reflux disease)     Past Surgical History  Procedure Date  . Wisdom tooth extraction     Family History  Problem Relation Age of Onset  . Hypertension Mother   . Diabetes Father   . Anesthesia problems Neg Hx   . Hypotension Neg Hx   . Malignant hyperthermia Neg Hx   . Pseudochol deficiency Neg Hx     History  Substance Use Topics  . Smoking status: Never Smoker   . Smokeless tobacco: Never Used  . Alcohol Use: Yes     socially prior to pregnancy    Allergies:  Allergies  Allergen Reactions  . Amoxicillin     REACTION: childhood reaction,swelling  . Penicillins     REACTION: childhood reaction,swelling  . Sulfa Antibiotics Swelling and Rash    Prescriptions prior to admission  Medication Sig Dispense Refill  . omeprazole (PRILOSEC) 20 MG capsule Take 20 mg by mouth daily.      . Prenatal Vit-Fe Fumarate-FA (PRENATAL MULTIVITAMIN) TABS Take 1 tablet by mouth daily.      Marland Kitchen albuterol (PROVENTIL HFA) 108 (90 BASE) MCG/ACT inhaler Inhale 2 puffs into the lungs every 6 (six) hours as needed. Asthma attacks        Review of Systems  Constitutional: Negative.   HENT: Negative.   Respiratory: Negative.   Cardiovascular: Negative.   Genitourinary:       Pelvic pressure.  Negative for vaginal bleeding or loss of fluid.  + Fetal movement   Physical  Exam   Blood pressure 127/69, pulse 99, temperature 98.6 F (37 C), temperature source Oral, resp. rate 18, height 6\' 4"  (1.93 m), weight 274 lb 6.4 oz (124.467 kg), last menstrual period 03/04/2011.  Physical Exam  Constitutional: She is oriented to person, place, and time. She appears well-developed and well-nourished. No distress.  Neurological: She is alert and oriented to person, place, and time.  Psychiatric: She has a normal mood and affect. Her behavior is normal.    MAU Course  Procedures  MDM Care turned over to K. Joandry Slagter, NP a 19:00  Assessment and Plan    KEY,EVE M 10/25/2011, 4:32 PM   1915 Reactive strip, occ contractions, Cx closed, long, firm. Results for orders placed during the hospital encounter of 10/25/11 (from the past 24 hour(s))  URINALYSIS, ROUTINE W REFLEX MICROSCOPIC     Status: Abnormal   Collection Time   10/25/11  3:55 PM      Component Value Range   Color, Urine YELLOW  YELLOW   APPearance CLEAR  CLEAR   Specific Gravity, Urine 1.015  1.005 - 1.030   pH 6.5  5.0 - 8.0   Glucose, UA NEGATIVE  NEGATIVE mg/dL   Hgb urine dipstick NEGATIVE  NEGATIVE   Bilirubin  Urine NEGATIVE  NEGATIVE   Ketones, ur NEGATIVE  NEGATIVE mg/dL   Protein, ur NEGATIVE  NEGATIVE mg/dL   Urobilinogen, UA 0.2  0.0 - 1.0 mg/dL   Nitrite NEGATIVE  NEGATIVE   Leukocytes, UA SMALL (*) NEGATIVE  URINE MICROSCOPIC-ADD ON     Status: Abnormal   Collection Time   10/25/11  3:55 PM      Component Value Range   Squamous Epithelial / LPF FEW (*) RARE   WBC, UA 3-6  <3 WBC/hpf   Bacteria, UA RARE  RARE   Urine-Other RARE YEAST       Consulted with Dr Tamela Oddi, pt may be discharged home. She has an appt Wed 7/17 with Dr Clearance Coots.  Precautions reviewed.

## 2011-10-28 ENCOUNTER — Other Ambulatory Visit: Payer: Self-pay | Admitting: Obstetrics

## 2011-10-28 DIAGNOSIS — D696 Thrombocytopenia, unspecified: Secondary | ICD-10-CM

## 2011-11-03 LAB — OB RESULTS CONSOLE ANTIBODY SCREEN: Antibody Screen: NEGATIVE

## 2011-11-04 LAB — OB RESULTS CONSOLE GBS: GBS: NEGATIVE

## 2011-11-05 ENCOUNTER — Ambulatory Visit (HOSPITAL_COMMUNITY)
Admission: RE | Admit: 2011-11-05 | Discharge: 2011-11-05 | Disposition: A | Discharge status: Home / Self Care | Payer: 59 | Source: Ambulatory Visit | Attending: Obstetrics | Admitting: Obstetrics

## 2011-11-05 DIAGNOSIS — D696 Thrombocytopenia, unspecified: Secondary | ICD-10-CM | POA: Insufficient documentation

## 2011-11-05 DIAGNOSIS — Z3689 Encounter for other specified antenatal screening: Secondary | ICD-10-CM | POA: Insufficient documentation

## 2011-11-05 DIAGNOSIS — D689 Coagulation defect, unspecified: Secondary | ICD-10-CM | POA: Insufficient documentation

## 2011-11-05 IMAGING — US US OB FOLLOW-UP
1 series · 13 of 28 positions shown · non-contrast
Comparison: none

[Series 1: us ob follow-up · 13 of 34 slices shown]
[im 2/34]
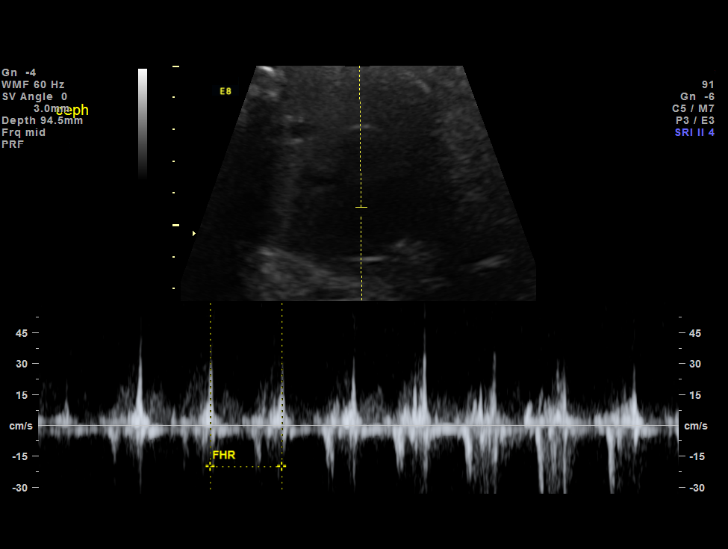
[im 4/34]
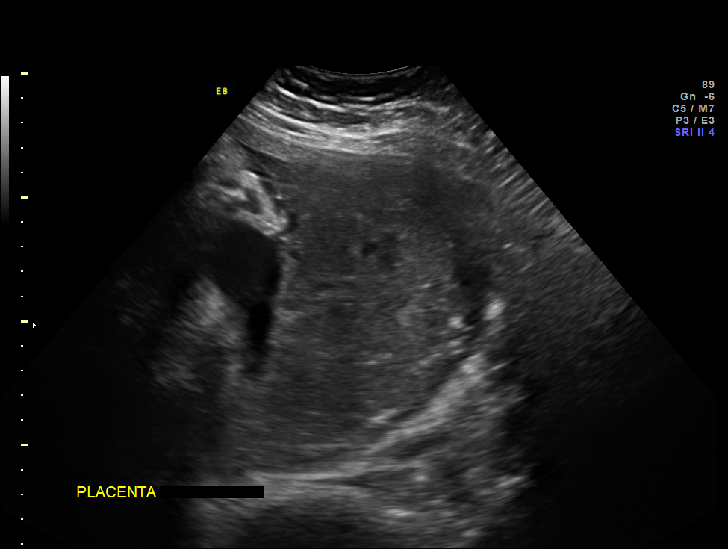
[im 7/34]
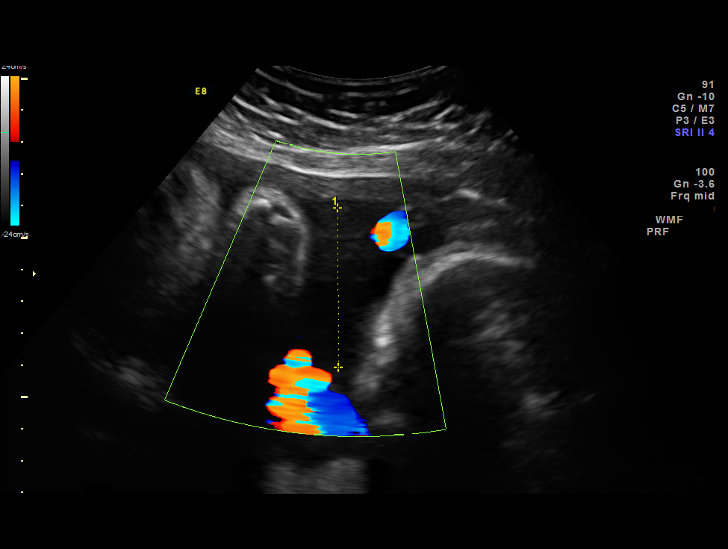
[im 9/34]
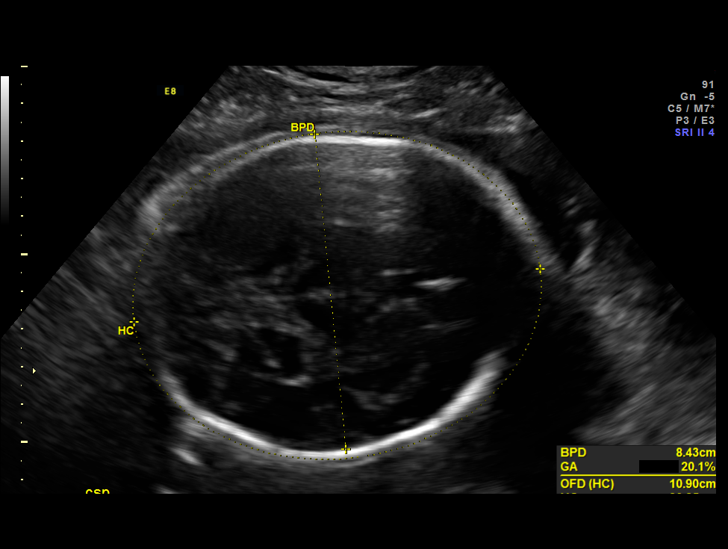
[im 12/34]
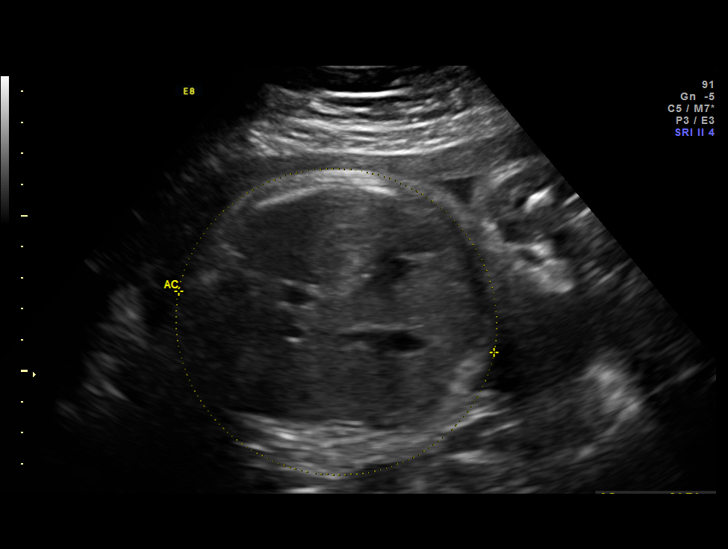
[im 14/34]
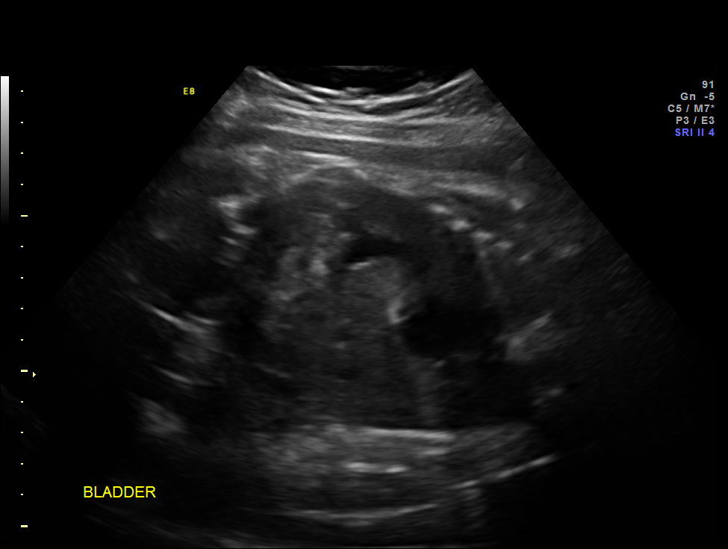
[im 18/34]
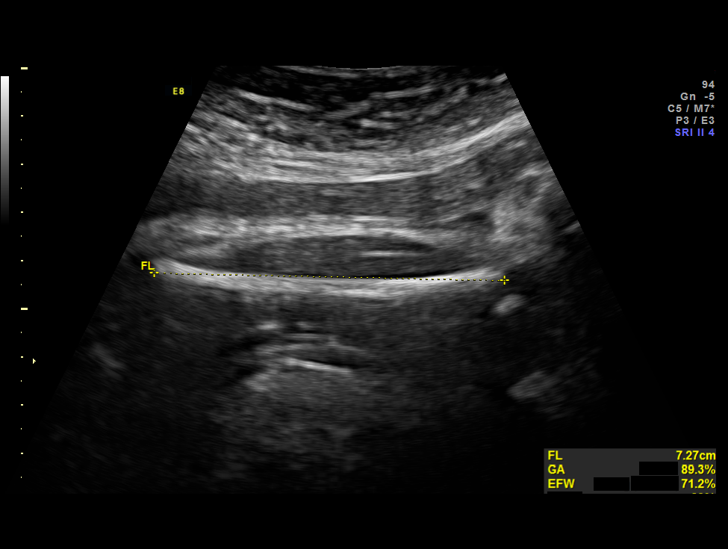
[im 20/34]
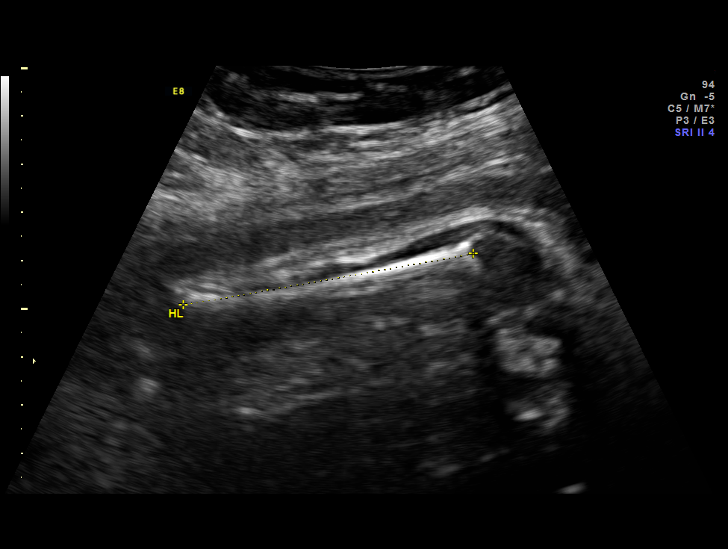
[im 23/34]
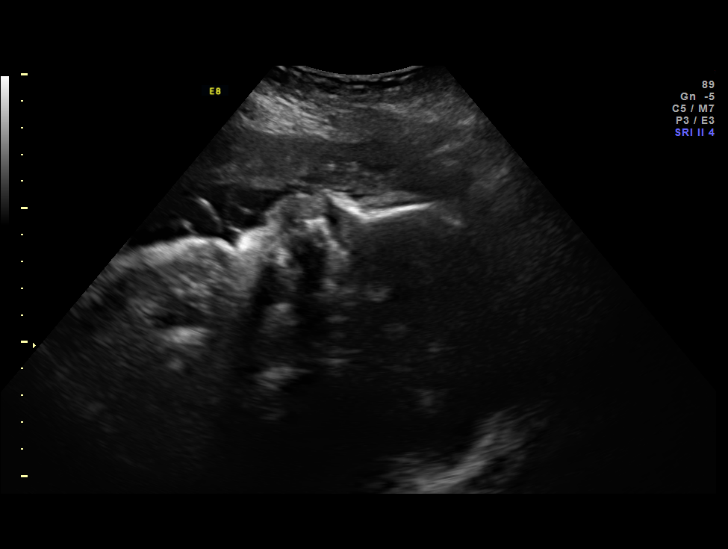
[im 25/34]
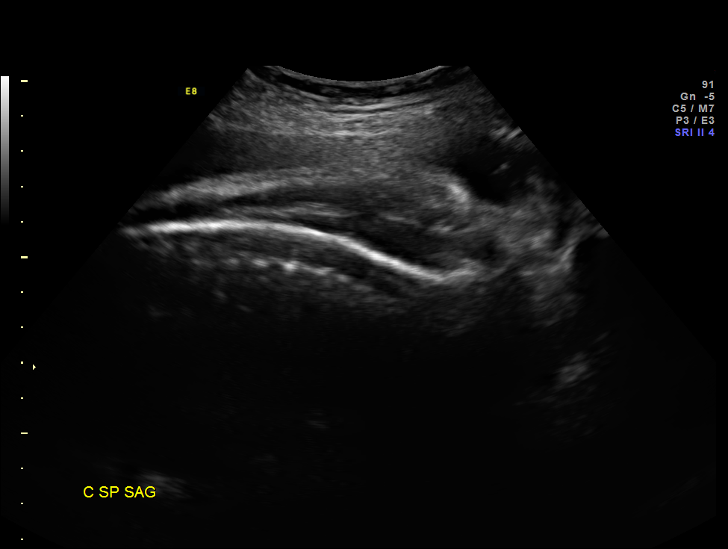
[im 27/34]
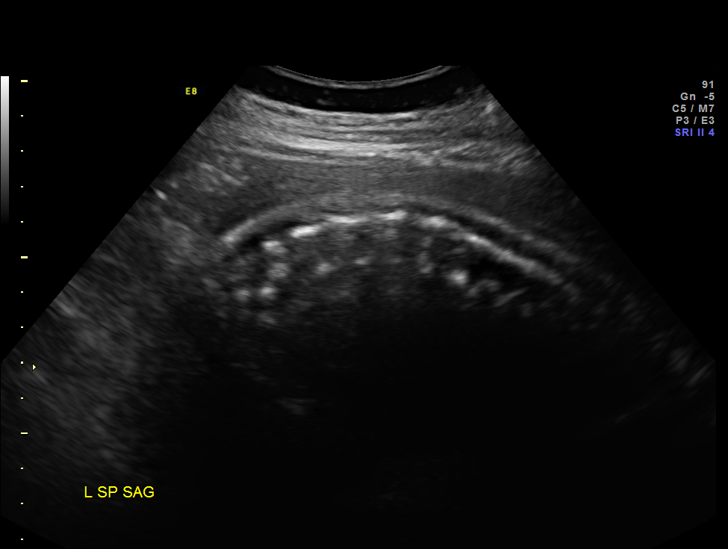
[im 30/34]
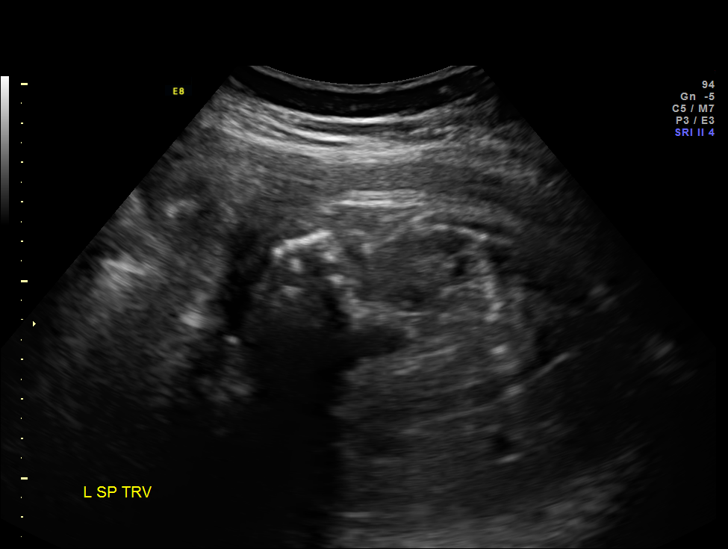
[im 32/34]
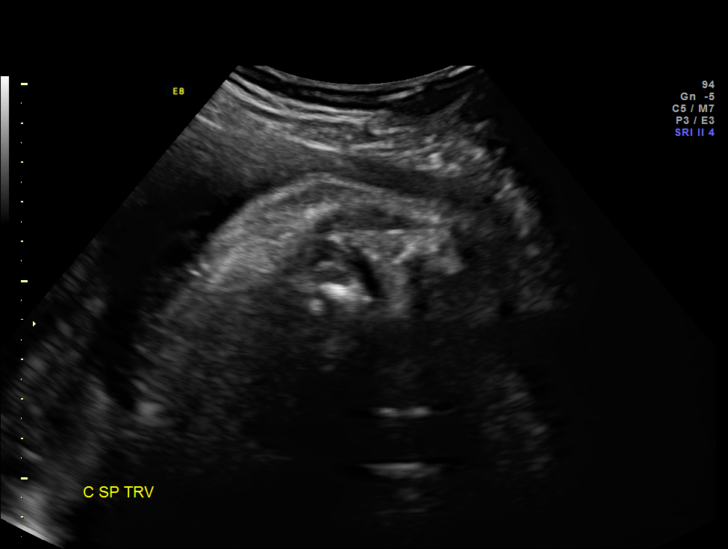

[13 of 28 positions shown; findings below may reference images not displayed]

OBSTETRICS REPORT
                      (Signed Final [DATE] [DATE])

 Order#:         [PHONE_NUMBER]_O
Procedures

 US OB FOLLOW UP                                       76816.1
Indications

 Thrombocytopenia
 Assess Fetal Growth / Estimated Fetal Weight
Fetal Evaluation

 Fetal Heart Rate:  141                          bpm
 Cardiac Activity:  Observed
 Presentation:      Cephalic
 Placenta:          Left lateral

 Amniotic Fluid
 AFI FV:      Subjectively within normal limits
                                             Larg Pckt:     5.0  cm
Biometry

 BPD:     84.3  mm     G. Age:  33w 6d                CI:         77.4   70 - 86
 OFD:    108.9  mm                                    FL/HC:      23.3   20.1 -

 HC:     308.6  mm     G. Age:  34w 3d        8  %    HC/AC:      0.97   0.93 -

 AC:     316.7  mm     G. Age:  35w 4d       70  %    FL/BPD:     85.2   71 - 87
 FL:      71.8  mm     G. Age:  36w 6d       82  %    FL/AC:      22.7   20 - 24
 HUM:     61.1  mm     G. Age:  35w 3d       70  %

 Est. FW:    [SO]  gm           6 lb     72  %
Gestational Age

 LMP:           35w 1d        Date:  [DATE]                 EDD:   [DATE]
 U/S Today:     35w 1d                                        EDD:   [DATE]
 Best:          35w 1d     Det. By:  LMP  ([DATE])          EDD:   [DATE]
Anatomy

 Cranium:           Appears normal      Aortic Arch:       Previously seen
 Fetal Cavum:       Appears normal      Ductal Arch:       Not well
                                                           visualized
 Ventricles:        Appears normal      Diaphragm:         Previously seen
 Choroid Plexus:    Previously seen     Stomach:           Appears normal
 Cerebellum:        Previously seen     Abdomen:           Appears normal
 Posterior Fossa:   Previously seen     Abdominal Wall:    Previously seen
 Nuchal Fold:       Not applicable      Cord Vessels:      Previously seen
                    (>20 wks GA)
 Face:              Previously seen     Kidneys:           Appear normal
 Heart:             Appears normal      Bladder:           Appears normal
                    (4 chamber &
                    axis)
 RVOT:              Previously seen     Spine:             Appears normal
 LVOT:              Previously seen     Limbs:             Previously seen

 Other:     Fetus appears to be a male. Technically difficult due to
            advanced GA and fetal position.
Cervix Uterus Adnexa

 Cervix:       Not visualized (advanced GA >29 wks)
Impression

 IUP at 35 [DATE] weeks
 Inerval growth is appropriate (72nd %tile)
 Normal amniotic fluid volume
Recommendations

 Recommend follow up ultrasounds as clinically indicated

 questions or concerns.

## 2011-11-05 NOTE — Progress Notes (Signed)
Patient seen today  for follow up ultrasound.  See full report in [facility].  Alpha Gula, MD  IUP at 35 1/7 weeks Inerval growth is appropriate (72nd %tile) Normal amniotic fluid volume  Recommend follow up ultrasounds as clinically indicated

## 2011-11-26 NOTE — Progress Notes (Signed)
MFM Note  Erica Wyatt is a 26 year old G1 AA female at 30+ weeks gestation who present for consultation regarding mild thrombocytopenia. In January, her platelet count was 179K and it fell to 143K in May. Erica Wyatt denies having low platelets in the past and denies any abnormal bruising, petechiae or epistaxis. She currently is taking prenatal vitamins and Prilosec. She also denies having a family history of bleeding disorders.  Assessment: 1) IUP at 30+6 weeks 2) Most likely gestational thrombocytopenia - she has no concurrent medical disorders and is not taking any medications known to cause thrombocytopenia  The implications of a low platelet count in pregnancy were discussed in detail. We reviewed the differences between gestational thrombocytopenia and immune thrombocytopenia including the neonatal concerns. The most important issue at this point in the pregnancy is for her to have an option to receive regional anesthesia; therefore, her platelets need to be > 100K at the time of delivery.  Recommendations: 1) Serial platelet counts every 2-3 weeks - platelet count today was 127,000 2) If her count is < 100K at 37-38 weeks, will try a short course of prednisone  3) If her count is < 70K, will consider further work-up for possible immune thrombocytopenia  It was a pleasure meeting Erica Wyatt. Please call if we can be of assistance.  (Face-to-face consultation with patient: 30 min)

## 2011-12-09 ENCOUNTER — Telehealth (HOSPITAL_COMMUNITY): Payer: Self-pay | Admitting: *Deleted

## 2011-12-09 ENCOUNTER — Encounter (HOSPITAL_COMMUNITY): Payer: Self-pay | Admitting: *Deleted

## 2011-12-09 ENCOUNTER — Other Ambulatory Visit: Payer: Self-pay | Admitting: Obstetrics

## 2011-12-09 ENCOUNTER — Inpatient Hospital Stay (HOSPITAL_COMMUNITY): Admission: AD | Admit: 2011-12-09 | Payer: Self-pay | Source: Ambulatory Visit | Admitting: Obstetrics

## 2011-12-09 NOTE — Telephone Encounter (Signed)
Preadmission screen  

## 2011-12-15 ENCOUNTER — Inpatient Hospital Stay (HOSPITAL_COMMUNITY): Payer: 59 | Admitting: Anesthesiology

## 2011-12-15 ENCOUNTER — Inpatient Hospital Stay (HOSPITAL_COMMUNITY)
Admission: RE | Admit: 2011-12-15 | Discharge: 2011-12-18 | DRG: 775 | Disposition: A | Discharge status: Home / Self Care | Payer: 59 | Source: Ambulatory Visit | Attending: Obstetrics | Admitting: Obstetrics

## 2011-12-15 ENCOUNTER — Encounter (HOSPITAL_COMMUNITY): Payer: Self-pay | Admitting: Anesthesiology

## 2011-12-15 ENCOUNTER — Encounter (HOSPITAL_COMMUNITY): Payer: Self-pay

## 2011-12-15 DIAGNOSIS — O480 Post-term pregnancy: Principal | ICD-10-CM | POA: Diagnosis present

## 2011-12-15 DIAGNOSIS — D696 Thrombocytopenia, unspecified: Secondary | ICD-10-CM | POA: Diagnosis present

## 2011-12-15 DIAGNOSIS — D689 Coagulation defect, unspecified: Secondary | ICD-10-CM | POA: Diagnosis present

## 2011-12-15 LAB — CBC
HCT: 33.3 % — ABNORMAL LOW (ref 36.0–46.0)
Hemoglobin: 11.1 g/dL — ABNORMAL LOW (ref 12.0–15.0)
MCH: 29.5 pg (ref 26.0–34.0)
MCV: 88.6 fL (ref 78.0–100.0)
Platelets: 125 10*3/uL — ABNORMAL LOW (ref 150–400)
RBC: 3.76 MIL/uL — ABNORMAL LOW (ref 3.87–5.11)
WBC: 8.4 10*3/uL (ref 4.0–10.5)

## 2011-12-15 MED ORDER — ONDANSETRON HCL 4 MG/2ML IJ SOLN: 4.0000 mg | Freq: Four times a day (QID) | INTRAMUSCULAR | Status: DC | PRN

## 2011-12-15 MED ORDER — LACTATED RINGERS IV SOLN
500.0000 mL | Freq: Once | INTRAVENOUS | Status: AC
  Administered 2011-12-15: 500 mL via INTRAVENOUS

## 2011-12-15 MED ORDER — PHENYLEPHRINE 40 MCG/ML (10ML) SYRINGE FOR IV PUSH (FOR BLOOD PRESSURE SUPPORT): 80.0000 ug | PREFILLED_SYRINGE | INTRAVENOUS | Status: DC | PRN

## 2011-12-15 MED ORDER — MISOPROSTOL 25 MCG QUARTER TABLET: 25.0000 ug | ORAL_TABLET | ORAL | Status: DC | PRN

## 2011-12-15 MED ORDER — DIPHENHYDRAMINE HCL 50 MG/ML IJ SOLN: 12.5000 mg | INTRAMUSCULAR | Status: DC | PRN

## 2011-12-15 MED ORDER — OXYCODONE-ACETAMINOPHEN 5-325 MG PO TABS: 1.0000 | ORAL_TABLET | ORAL | Status: DC | PRN

## 2011-12-15 MED ORDER — FENTANYL 2.5 MCG/ML BUPIVACAINE 1/10 % EPIDURAL INFUSION (WH - ANES)
INTRAMUSCULAR | Status: DC | PRN
  Administered 2011-12-15: 16 mL/h via EPIDURAL

## 2011-12-15 MED ORDER — LIDOCAINE HCL (PF) 1 % IJ SOLN
30.0000 mL | INTRAMUSCULAR | Status: DC | PRN
  Filled 2011-12-15 (×2): qty 30

## 2011-12-15 MED ORDER — IBUPROFEN 600 MG PO TABS: 600.0000 mg | ORAL_TABLET | Freq: Four times a day (QID) | ORAL | Status: DC | PRN

## 2011-12-15 MED ORDER — SODIUM BICARBONATE 8.4 % IV SOLN
INTRAVENOUS | Status: DC | PRN
  Administered 2011-12-15: 5 mL via EPIDURAL

## 2011-12-15 MED ORDER — OXYTOCIN 40 UNITS IN LACTATED RINGERS INFUSION - SIMPLE MED: 1.0000 m[IU]/min | INTRAVENOUS | Status: DC

## 2011-12-15 MED ORDER — FLEET ENEMA 7-19 GM/118ML RE ENEM: 1.0000 | ENEMA | RECTAL | Status: DC | PRN

## 2011-12-15 MED ORDER — OXYTOCIN BOLUS FROM INFUSION
250.0000 mL | Freq: Once | INTRAVENOUS | Status: DC
  Filled 2011-12-15: qty 500

## 2011-12-15 MED ORDER — TERBUTALINE SULFATE 1 MG/ML IJ SOLN: 0.2500 mg | Freq: Once | INTRAMUSCULAR | Status: AC | PRN

## 2011-12-15 MED ORDER — EPHEDRINE 5 MG/ML INJ
10.0000 mg | INTRAVENOUS | Status: DC | PRN
  Filled 2011-12-15: qty 4

## 2011-12-15 MED ORDER — LACTATED RINGERS IV SOLN
INTRAVENOUS | Status: DC
  Administered 2011-12-15 – 2011-12-16 (×5): via INTRAVENOUS

## 2011-12-15 MED ORDER — LACTATED RINGERS IV SOLN
500.0000 mL | INTRAVENOUS | Status: DC | PRN
  Administered 2011-12-15: 500 mL via INTRAVENOUS

## 2011-12-15 MED ORDER — OXYTOCIN 40 UNITS IN LACTATED RINGERS INFUSION - SIMPLE MED
62.5000 mL/h | Freq: Once | INTRAVENOUS | Status: DC
  Filled 2011-12-15: qty 1000

## 2011-12-15 MED ORDER — EPHEDRINE 5 MG/ML INJ: 10.0000 mg | INTRAVENOUS | Status: DC | PRN

## 2011-12-15 MED ORDER — CITRIC ACID-SODIUM CITRATE 334-500 MG/5ML PO SOLN: 30.0000 mL | ORAL | Status: DC | PRN

## 2011-12-15 MED ORDER — TERBUTALINE SULFATE 1 MG/ML IJ SOLN: 0.2500 mg | Freq: Once | INTRAMUSCULAR | Status: DC | PRN

## 2011-12-15 MED ORDER — MISOPROSTOL 25 MCG QUARTER TABLET
25.0000 ug | ORAL_TABLET | ORAL | Status: DC | PRN
  Administered 2011-12-15 (×3): 25 ug via VAGINAL
  Filled 2011-12-15 (×3): qty 0.25

## 2011-12-15 MED ORDER — FENTANYL 2.5 MCG/ML BUPIVACAINE 1/10 % EPIDURAL INFUSION (WH - ANES)
14.0000 mL/h | INTRAMUSCULAR | Status: DC
  Administered 2011-12-16: 14 mL/h via EPIDURAL
  Filled 2011-12-15 (×2): qty 60

## 2011-12-15 MED ORDER — ACETAMINOPHEN 325 MG PO TABS: 650.0000 mg | ORAL_TABLET | ORAL | Status: DC | PRN

## 2011-12-15 MED ORDER — PHENYLEPHRINE 40 MCG/ML (10ML) SYRINGE FOR IV PUSH (FOR BLOOD PRESSURE SUPPORT)
80.0000 ug | PREFILLED_SYRINGE | INTRAVENOUS | Status: DC | PRN
  Filled 2011-12-15: qty 5

## 2011-12-15 NOTE — Anesthesia Procedure Notes (Signed)
Epidural Patient location during procedure: OB  Preanesthetic Checklist Completed: patient identified, site marked, surgical consent, pre-op evaluation, timeout performed, IV checked, risks and benefits discussed and monitors and equipment checked  Epidural Patient position: sitting Prep: site prepped and draped and DuraPrep Patient monitoring: continuous pulse ox and blood pressure Approach: midline Injection technique: LOR air  Needle:  Needle type: Tuohy  Needle gauge: 17 G Needle length: 9 cm and 9 Needle insertion depth: 7 cm Catheter type: closed end flexible Catheter size: 19 Gauge Catheter at skin depth: 14 cm Test dose: negative  Assessment Events: blood not aspirated, injection not painful, no injection resistance, negative IV test and no paresthesia  Additional Notes Dosing of Epidural:  1st dose, through needle ............................................. epi 1:200K + Xylocaine 40 mg  2nd dose, through catheter, after waiting 3 minutes.....epi 1:200K + Xylocaine 60 mg  3rd dose, through catheter after waiting 3 minutes .............................Marcaine   5mg   ( mg Marcaine are expressed as equivilent  cc's medication removed from the 0.1%Bupiv / fentanyl syringe from L&D pump)  ( 2% Xylo charted as a single dose in Epic Meds for ease of charting; actual dosing was fractionated as above, for saftey's sake)  As each dose occurred, patient was free of IV sx; and patient exhibited no evidence of SA injection.  Patient is more comfortable after epidural dosed. Please see RN's note for documentation of vital signs,and FHR which are stable.  Patient reminded not to try to ambulate with numb legs, and that an RN must be present when she attempts to get up.       

## 2011-12-15 NOTE — H&P (Signed)
Erica Wyatt is a 26 y.o. female presenting for IOL. Maternal Medical History:  Reason for admission: 26 yo G1  EDC 12-09-11.  Presents for IOL for postdates.   Contractions: Onset was 3-5 hours ago.   Frequency: irregular.   Perceived severity is mild.    Fetal activity: Perceived fetal activity is normal.   Last perceived fetal movement was within the past hour.    Prenatal complications: no prenatal complications Prenatal Complications - Diabetes: none.    OB History    Grav Para Term Preterm Abortions TAB SAB Ect Mult Living   1 0 0 0 0 0 0 0 0 0      Past Medical History  Diagnosis Date  . Asthma     exercise inducted  . Migraines   . Migraine headache   . GERD (gastroesophageal reflux disease)   . Thrombocytopenia complicating pregnancy    Past Surgical History  Procedure Date  . Wisdom tooth extraction    Family History: family history includes Diabetes in her father and Hypertension in her mother.  There is no history of Anesthesia problems, and Hypotension, and Malignant hyperthermia, and Pseudochol deficiency, . Social History:  reports that she has never smoked. She has never used smokeless tobacco. She reports that she drinks alcohol. She reports that she does not use illicit drugs.   Prenatal Transfer Tool  Maternal Diabetes: No Genetic Screening: Normal Maternal Ultrasounds/Referrals: Normal Fetal Ultrasounds or other Referrals:  None Maternal Substance Abuse:  No Significant Maternal Medications:  Meds include: Other: PNV Significant Maternal Lab Results:  Lab values include: Group B Strep negative Other Comments:  None  Review of Systems  All other systems reviewed and are negative.    Dilation: Fingertip Effacement (%): 40 Station: -2 Exam by:: GPayne, RN Blood pressure 121/70, pulse 74, temperature 98.6 F (37 C), temperature source Oral, resp. rate 20, height 6\' 4"  (1.93 m), weight 128.368 kg (283 lb), last menstrual period  03/04/2011. Maternal Exam:  Uterine Assessment: Contraction strength is mild.  Contraction frequency is irregular.   Abdomen: Patient reports no abdominal tenderness. Fetal presentation: vertex  Introitus: Normal vulva. Normal vagina.    Physical Exam  Nursing note and vitals reviewed. Constitutional: She is oriented to person, place, and time. She appears well-developed and well-nourished.  HENT:  Head: Normocephalic and atraumatic.  Eyes: Conjunctivae are normal. Pupils are equal, round, and reactive to light.  Neck: Normal range of motion. Neck supple.  Cardiovascular: Normal rate and regular rhythm.   Respiratory: Effort normal and breath sounds normal.  GI: Soft.  Genitourinary: Vagina normal and uterus normal.  Musculoskeletal: Normal range of motion.  Neurological: She is alert and oriented to person, place, and time.  Skin: Skin is warm and dry.  Psychiatric: She has a normal mood and affect. Her behavior is normal. Judgment and thought content normal.    Prenatal labs: ABO, Rh: A/--/-- (07/23 0000) Antibody: Negative (07/23 0000) Rubella: Immune (01/22 0000) RPR: Nonreactive (01/22 0000)  HBsAg: Negative (01/22 0000)  HIV: Non-reactive (01/22 0000)  GBS: Negative (07/24 0000)   Assessment/Plan: 40.6 weeks.  2 stage IOL.   HARPER,CHARLES A 12/15/2011, 8:21 AM

## 2011-12-15 NOTE — Progress Notes (Signed)
TEONA VARGUS is a 26 y.o. G1P0000 at 106w6d by LMP admitted for induction of labor due to Post dates. Due date 12-09-11.  Subjective:   Objective: BP 115/63  Pulse 80  Temp 98.6 F (37 C) (Oral)  Resp 20  Ht 6\' 4"  (1.93 m)  Wt 128.368 kg (283 lb)  BMI 34.45 kg/m2  LMP 03/04/2011      FHT:  FHR: 150 bpm, variability: moderate,  accelerations:  Present,  decelerations:  Absent UC:   irregular, every 5-9 minutes SVE:   Dilation: 1 Effacement (%): 30 Station: -2 Exam by:: Clearence Cheek RN  Labs: Lab Results  Component Value Date   WBC 8.4 12/15/2011   HGB 11.1* 12/15/2011   HCT 33.3* 12/15/2011   MCV 88.6 12/15/2011   PLT 125* 12/15/2011    Assessment / Plan: Induction of labor due to postterm,  progressing well on pitocin  Labor: Latent phase Preeclampsia:  n/a Fetal Wellbeing:  Category I Pain Control:  Labor support without medications I/D:  n/a Anticipated MOD:  NSVD  HARPER,CHARLES A 12/15/2011, 10:12 AM

## 2011-12-15 NOTE — Anesthesia Preprocedure Evaluation (Addendum)
Anesthesia Evaluation  Patient identified by MRN, date of birth, ID band Patient awake    Reviewed: Allergy & Precautions, H&P , Patient's Chart, lab work & pertinent test results  Airway Mallampati: II  TM Distance: >3 FB Neck ROM: full    Dental  (+) Teeth Intact   Pulmonary asthma ,  breath sounds clear to auscultation        Cardiovascular Rhythm:regular Rate:Normal     Neuro/Psych    GI/Hepatic GERD-  Medicated,  Endo/Other    Renal/GU      Musculoskeletal   Abdominal   Peds  Hematology   Anesthesia Other Findings       Reproductive/Obstetrics (+) Pregnancy                            Anesthesia Physical Anesthesia Plan  ASA: II  Anesthesia Plan: Epidural   Post-op Pain Management:    Induction:   Airway Management Planned:   Additional Equipment:   Intra-op Plan:   Post-operative Plan:   Informed Consent: I have reviewed the patients History and Physical, chart, labs and discussed the procedure including the risks, benefits and alternatives for the proposed anesthesia with the patient or authorized representative who has indicated his/her understanding and acceptance.   Dental Advisory Given  Plan Discussed with:   Anesthesia Plan Comments: (Labs checked- platelets confirmed with RN in room. Fetal heart tracing, per RN, reported to be stable enough for sitting procedure. Discussed epidural, and patient consents to the procedure:  included risk of possible headache,backache, failed block, allergic reaction, and nerve injury. This patient was asked if she had any questions or concerns before the procedure started.)        Anesthesia Quick Evaluation  

## 2011-12-16 ENCOUNTER — Encounter (HOSPITAL_COMMUNITY): Payer: Self-pay

## 2011-12-16 LAB — CBC
MCH: 29.6 pg (ref 26.0–34.0)
MCHC: 33.7 g/dL (ref 30.0–36.0)
Platelets: 105 10*3/uL — ABNORMAL LOW (ref 150–400)
RBC: 3.41 MIL/uL — ABNORMAL LOW (ref 3.87–5.11)

## 2011-12-16 MED ORDER — LANOLIN HYDROUS EX OINT: TOPICAL_OINTMENT | CUTANEOUS | Status: DC | PRN

## 2011-12-16 MED ORDER — SENNOSIDES-DOCUSATE SODIUM 8.6-50 MG PO TABS
2.0000 | ORAL_TABLET | Freq: Every day | ORAL | Status: DC
  Administered 2011-12-16 – 2011-12-17 (×2): 2 via ORAL

## 2011-12-16 MED ORDER — IBUPROFEN 600 MG PO TABS
600.0000 mg | ORAL_TABLET | Freq: Four times a day (QID) | ORAL | Status: DC
  Administered 2011-12-16 – 2011-12-18 (×10): 600 mg via ORAL
  Filled 2011-12-16 (×10): qty 1

## 2011-12-16 MED ORDER — DIPHENHYDRAMINE HCL 25 MG PO CAPS: 25.0000 mg | ORAL_CAPSULE | Freq: Four times a day (QID) | ORAL | Status: DC | PRN

## 2011-12-16 MED ORDER — SIMETHICONE 80 MG PO CHEW: 80.0000 mg | CHEWABLE_TABLET | ORAL | Status: DC | PRN

## 2011-12-16 MED ORDER — DIBUCAINE 1 % RE OINT
1.0000 "application " | TOPICAL_OINTMENT | RECTAL | Status: DC | PRN
  Administered 2011-12-16: 1 via RECTAL
  Filled 2011-12-16: qty 28

## 2011-12-16 MED ORDER — ZOLPIDEM TARTRATE 5 MG PO TABS: 5.0000 mg | ORAL_TABLET | Freq: Every evening | ORAL | Status: DC | PRN

## 2011-12-16 MED ORDER — ONDANSETRON HCL 4 MG PO TABS: 4.0000 mg | ORAL_TABLET | ORAL | Status: DC | PRN

## 2011-12-16 MED ORDER — PRENATAL MULTIVITAMIN CH
1.0000 | ORAL_TABLET | Freq: Every day | ORAL | Status: DC
  Administered 2011-12-16 – 2011-12-18 (×3): 1 via ORAL
  Filled 2011-12-16 (×3): qty 1

## 2011-12-16 MED ORDER — OXYTOCIN 40 UNITS IN LACTATED RINGERS INFUSION - SIMPLE MED: 62.5000 mL/h | INTRAVENOUS | Status: DC | PRN

## 2011-12-16 MED ORDER — MEDROXYPROGESTERONE ACETATE 150 MG/ML IM SUSP: 150.0000 mg | INTRAMUSCULAR | Status: DC | PRN

## 2011-12-16 MED ORDER — BENZOCAINE-MENTHOL 20-0.5 % EX AERO
1.0000 "application " | INHALATION_SPRAY | CUTANEOUS | Status: DC | PRN
  Administered 2011-12-16: 1 via TOPICAL
  Filled 2011-12-16: qty 56

## 2011-12-16 MED ORDER — ONDANSETRON HCL 4 MG/2ML IJ SOLN
4.0000 mg | INTRAMUSCULAR | Status: DC | PRN
  Administered 2011-12-16: 4 mg via INTRAVENOUS
  Filled 2011-12-16: qty 2

## 2011-12-16 MED ORDER — WITCH HAZEL-GLYCERIN EX PADS
1.0000 "application " | MEDICATED_PAD | CUTANEOUS | Status: DC | PRN
  Administered 2011-12-16: 1 via TOPICAL

## 2011-12-16 MED ORDER — OXYCODONE-ACETAMINOPHEN 5-325 MG PO TABS: 1.0000 | ORAL_TABLET | ORAL | Status: DC | PRN

## 2011-12-16 MED ORDER — TETANUS-DIPHTH-ACELL PERTUSSIS 5-2.5-18.5 LF-MCG/0.5 IM SUSP
0.5000 mL | Freq: Once | INTRAMUSCULAR | Status: AC
  Administered 2011-12-16: 0.5 mL via INTRAMUSCULAR
  Filled 2011-12-16: qty 0.5

## 2011-12-16 NOTE — Anesthesia Postprocedure Evaluation (Signed)
  Anesthesia Post-op Note  Patient: Erica Wyatt  Procedure(s) Performed: * No procedures listed *  Patient Location: Mother/Baby  Anesthesia Type: Epidural  Level of Consciousness: awake, alert  and oriented  Airway and Oxygen Therapy: Patient Spontanous Breathing  Post-op Pain: none  Post-op Assessment: Post-op Vital signs reviewed, Patient's Cardiovascular Status Stable, No headache, No backache, No residual numbness and No residual motor weakness  Post-op Vital Signs: Reviewed and stable  Complications: No apparent anesthesia complications

## 2011-12-16 NOTE — Progress Notes (Signed)
Post Partum Day 0 Subjective: no complaints  Objective: Blood pressure 102/62, pulse 69, temperature 98.8 F (37.1 C), temperature source Oral, resp. rate 20, height 6\' 4"  (1.93 m), weight 128.368 kg (283 lb), last menstrual period 03/04/2011, SpO2 100.00%, unknown if currently breastfeeding.  Physical Exam:  General: alert and no distress Lochia: appropriate Uterine Fundus: firm Incision: healing well DVT Evaluation: No evidence of DVT seen on physical exam.   Basename 12/16/11 0520 12/15/11 0605  HGB 10.1* 11.1*  HCT 30.0* 33.3*    Assessment/Plan: Plan for discharge tomorrow   LOS: 1 day   Karrie Fluellen A 12/16/2011, 10:02 AM

## 2011-12-16 NOTE — Progress Notes (Signed)
Erica Wyatt is a 26 y.o. G1P0000 at [redacted]w[redacted]d by LMP admitted for induction of labor due to Post dates. Due date 12-09-11.  Subjective:   Objective: BP 126/75  Pulse 67  Temp 99.2 F (37.3 C) (Oral)  Resp 18  Ht 6\' 4"  (1.93 m)  Wt 128.368 kg (283 lb)  BMI 34.45 kg/m2  SpO2 100%  LMP 03/04/2011      FHT:  FHR: 150 bpm, variability: moderate,  accelerations:  Present,  decelerations:  Absent UC:   regular, every 3 minutes SVE:   Dilation: 10 Effacement (%): 100 Station: 0 Exam by:: GPayne, RN  Labs: Lab Results  Component Value Date   WBC 8.4 12/15/2011   HGB 11.1* 12/15/2011   HCT 33.3* 12/15/2011   MCV 88.6 12/15/2011   PLT 125* 12/15/2011    Assessment / Plan: Induction of labor due to postterm,  progressing well on pitocin  Labor: Progressing normally Preeclampsia:  n/a Fetal Wellbeing:  Category I Pain Control:  Epidural I/D:  n/a Anticipated MOD:  NSVD  Ewan Grau A 12/16/2011, 12:16 AM

## 2011-12-17 NOTE — Progress Notes (Signed)
Patient ID: Erica Wyatt, female   DOB: 1985-09-10, 26 y.o.   MRN: [id number] Post Partum Day 1 S/P spontaneous vaginal RH status/Rubella reviewed.  Feeding: unknown Subjective: No HA, SOB, CP, F/C, breast symptoms. Normal vaginal bleeding, no clots.     Objective: BP 103/67  Pulse 84  Temp 97.2 F (36.2 C) (Oral)  Resp 18  Ht 6\' 4"  (1.93 m)  Wt 128.368 kg (283 lb)  BMI 34.45 kg/m2  SpO2 100%  LMP 03/04/2011  Breastfeeding? Unknown   Physical Exam:  General: alert Lochia: appropriate Uterine Fundus: firm DVT Evaluation: No evidence of DVT seen on physical exam. Ext: No c/c/e  Basename 12/16/11 0520 12/15/11 0605  HGB 10.1* 11.1*  HCT 30.0* 33.3*      Assessment/Plan: 26 y.o.  PPD #1 .  normal postpartum exam Continue current postpartum care  Ambulate   LOS: 2 days   JACKSON-MOORE,Hadleigh Felber A 12/17/2011, 10:05 AM

## 2011-12-18 MED ORDER — OXYCODONE-ACETAMINOPHEN 5-325 MG PO TABS: 1.0000 | ORAL_TABLET | ORAL | Status: DC | PRN

## 2011-12-18 MED ORDER — NORETHINDRONE 0.35 MG PO TABS: 1.0000 | ORAL_TABLET | Freq: Every day | ORAL | Status: DC

## 2011-12-18 MED ORDER — IBUPROFEN 600 MG PO TABS: 600.0000 mg | ORAL_TABLET | Freq: Four times a day (QID) | ORAL | Status: DC

## 2011-12-18 NOTE — Progress Notes (Signed)
Post Partum Day 2 Subjective: no complaints  Objective: Blood pressure 125/73, pulse 98, temperature 98.8 F (37.1 C), temperature source Oral, resp. rate 18, height 6\' 4"  (1.93 m), weight 128.368 kg (283 lb), last menstrual period 03/04/2011, SpO2 100.00%, unknown if currently breastfeeding.  Physical Exam:  General: alert and no distress Lochia: appropriate Uterine Fundus: firm Incision: healing well DVT Evaluation: No evidence of DVT seen on physical exam.   Basename 12/16/11 0520  HGB 10.1*  HCT 30.0*    Assessment/Plan: Discharge home   LOS: 3 days   Erica Wyatt A 12/18/2011, 7:40 AM

## 2011-12-18 NOTE — Discharge Summary (Signed)
Obstetric Discharge Summary Reason for Admission: induction of labor Prenatal Procedures: ultrasound Intrapartum Procedures: spontaneous vaginal delivery Postpartum Procedures: none Complications-Operative and Postpartum: none Hemoglobin  Date Value Range Status  12/16/2011 10.1* 12.0 - 15.0 g/dL Final     HCT  Date Value Range Status  12/16/2011 30.0* 36.0 - 46.0 % Final    Physical Exam:  General: alert and no distress Lochia: appropriate Uterine Fundus: firm Incision: healing well DVT Evaluation: No evidence of DVT seen on physical exam.  Discharge Diagnoses: Term Pregnancy-delivered  Discharge Information: Date: 12/18/2011 Activity: pelvic rest Diet: routine Medications: PNV, Ibuprofen, Colace and Percocet Condition: stable Instructions: refer to practice specific booklet Discharge to: home Follow-up Information    Follow up with HARPER,CHARLES A, MD. Schedule an appointment as soon as possible for a visit in 6 weeks.   Contact information:   [address] [place] [address] [phone]          Newborn Data: Live born female  Birth Weight: 8 lb 1.5 oz (3671 g) APGAR: 7, 8  Home with mother.  HARPER,CHARLES A 12/18/2011, 7:45 AM

## 2012-09-20 ENCOUNTER — Encounter: Payer: Self-pay | Admitting: Obstetrics

## 2012-10-20 ENCOUNTER — Telehealth: Payer: Self-pay

## 2012-10-20 NOTE — Telephone Encounter (Signed)
Left message asking patient to call back

## 2012-10-20 NOTE — Telephone Encounter (Signed)
Unfortunately I do not prescribe ADD medications without formal psych evaluation records.

## 2012-10-20 NOTE — Telephone Encounter (Signed)
Pt already has appt scheduled on 10/21/12 with Dr Dayton Martes to get on ADD med; pt tried to get medical records from [facility] to verify pt has been tested and has dx of ADD but pt said would take at least 2 weeks to get records sent to Dr Dayton Martes. Pt wants to know if Dr Dayton Martes would accept a letter from [facility] stating pt has diagnosis of ADD so pt could keep appt on 10/21/12.Please advise.

## 2012-10-21 ENCOUNTER — Ambulatory Visit: Payer: 59 | Admitting: Family Medicine

## 2012-10-21 NOTE — Telephone Encounter (Signed)
Advised patient, she will wait for records to arrive.

## 2012-10-21 NOTE — Telephone Encounter (Signed)
Left message asking patient to call back

## 2012-12-21 ENCOUNTER — Encounter: Payer: Self-pay | Admitting: Family Medicine

## 2012-12-21 ENCOUNTER — Ambulatory Visit (INDEPENDENT_AMBULATORY_CARE_PROVIDER_SITE_OTHER): Payer: 59 | Admitting: Family Medicine

## 2012-12-21 VITALS — BP 120/74 | HR 96 | Temp 98.6°F | Ht 76.0 in | Wt 250.0 lb

## 2012-12-21 DIAGNOSIS — F988 Other specified behavioral and emotional disorders with onset usually occurring in childhood and adolescence: Secondary | ICD-10-CM

## 2012-12-21 DIAGNOSIS — G43909 Migraine, unspecified, not intractable, without status migrainosus: Secondary | ICD-10-CM

## 2012-12-21 DIAGNOSIS — Z23 Encounter for immunization: Secondary | ICD-10-CM

## 2012-12-21 MED ORDER — AMPHETAMINE-DEXTROAMPHET ER 10 MG PO CP24: 10.0000 mg | ORAL_CAPSULE | ORAL | Status: DC

## 2012-12-21 NOTE — Progress Notes (Signed)
Subjective:    Patient ID: Erica Wyatt, female    DOB: Feb 18, 1986, 27 y.o.   MRN: [id number]  HPI  27 yo pleasant female here to discuss:  1.  ADD- diagnosed in elementary school through formal testing, then went through formal testing again in college.  Took strattera for short period of time.   She is having a lot of difficulty concentrating at home and at work.  Has a one year old son and it often takes her a long time to get out of the house in the morning because of all the things she has to do to get him ready. Her colleagues have noticed this too.  2.  Migraine HA- [facility].  Topamax gave her suicidal thoughts.  In past, took imitrex but stopped this due to pregnancy. She is not breast feeding. Currently not taking OCPs but not interested in having another child soon.  Having 2-3 migraines per week.  Associated with nausea.  Patient Active Problem List   Diagnosis Date Noted  . ADD (attention deficit disorder) 12/21/2012  . Normal delivery 12/17/2011  . MIGRAINE HEADACHE 09/26/2009  . ASTHMA 09/26/2009  . VAGINITIS, BACTERIAL, RECURRENT 09/26/2009  . ACNE, MILD 09/26/2009   Past Medical History  Diagnosis Date  . Asthma     exercise inducted  . Migraines   . Migraine headache   . GERD (gastroesophageal reflux disease)   . Thrombocytopenia complicating pregnancy    Past Surgical History  Procedure Laterality Date  . Wisdom tooth extraction     History  Substance Use Topics  . Smoking status: Never Smoker   . Smokeless tobacco: Never Used  . Alcohol Use: Yes     Comment: socially prior to pregnancy   Family History  Problem Relation Age of Onset  . Hypertension Mother   . Diabetes Father   . Anesthesia problems Neg Hx   . Hypotension Neg Hx   . Malignant hyperthermia Neg Hx   . Pseudochol deficiency Neg Hx    Allergies  Allergen Reactions  . Amoxicillin     REACTION: childhood reaction,swelling  . Penicillins     REACTION:  childhood reaction,swelling  . Topamax [Topiramate]     Suicidal thoughts  . Sulfa Antibiotics Swelling and Rash   Current Outpatient Prescriptions on File Prior to Visit  Medication Sig Dispense Refill  . albuterol (PROVENTIL HFA) 108 (90 BASE) MCG/ACT inhaler Inhale 2 puffs into the lungs every 6 (six) hours as needed. Asthma attacks      . ibuprofen (ADVIL,MOTRIN) 600 MG tablet Take 1 tablet (600 mg total) by mouth every 6 (six) hours.  30 tablet  5  . omeprazole (PRILOSEC) 20 MG capsule Take 20 mg by mouth daily.      . Prenatal Vit-Fe Fumarate-FA (PRENATAL MULTIVITAMIN) TABS Take 1 tablet by mouth daily.      . [DISCONTINUED] drospirenone-ethinyl estradiol (YAZ) 3-0.02 MG per tablet Take 1 tablet by mouth daily.  30 tablet  11  . [DISCONTINUED] SUMAtriptan (IMITREX) 25 MG tablet Take 1 tablet (25 mg total) by mouth every 2 (two) hours as needed for migraine. Take one tablet by mouth at onset of headache, may repeat in one hour  10 tablet  0   No current facility-administered medications on file prior to visit.   The PMH, PSH, Social History, Family History, Medications, and allergies have been reviewed in [facility], and have been updated if relevant.     Review of Systems  See HPI No photophobia No other focal neurological issues    Objective:   Physical Exam BP 120/74  Pulse 96  Temp(Src) 98.6 F (37 C)  Ht 6\' 4"  (1.93 m)  Wt 250 lb (113.399 kg)  BMI 30.44 kg/m2  Breastfeeding? No  General:  Well-developed,well-nourished,in no acute distress; alert,appropriate and cooperative throughout examination Head:  normocephalic and atraumatic.   Lungs:  Normal respiratory effort, chest expands symmetrically. Lungs are clear to auscultation, no crackles or wheezes. Heart:  Normal rate and regular rhythm. S1 and S2 normal without gallop, murmur, click, rub or other extra sounds. Abdomen:  Bowel sounds positive,abdomen soft and non-tender without masses, organomegaly or hernias  noted. Msk:  No deformity or scoliosis noted of thoracic or lumbar spine.   Extremities:  No clubbing, cyanosis, edema, or deformity noted with normal full range of motion of all joints.   Neurologic:  alert & oriented X3 and gait normal.   Skin:  Intact without suspicious lesions or rashes Psych:  Cognition and judgment appear intact. Alert and cooperative with normal attention span and concentration. No apparent delusions, illusions, hallucinations    Assessment & Plan:  1. MIGRAINE HEADACHE Deteriorated.  Needs preventative medication. Discuss amitriptlyine, propranolol.  Hesitant to start ADD medication and migraine medication at once.  She will call me in a couple of weeks.  2. ADD (attention deficit disorder) Deteriorated.  Discussed tx options. Will start Adderall 10 mg XL daily. Follow up in 2-3 weeks by phone. The patient indicates understanding of these issues and agrees with the plan.

## 2012-12-21 NOTE — Addendum Note (Signed)
Addended by: Eliezer Bottom on: 12/21/2012 11:13 AM   Modules accepted: Orders

## 2012-12-21 NOTE — Patient Instructions (Addendum)
Good to see you. We are starting Adderall 10 mg XR daily- please call me in a couple of weeks with an update.

## 2013-01-17 ENCOUNTER — Telehealth: Payer: Self-pay

## 2013-01-17 NOTE — Telephone Encounter (Signed)
Pt left v/m to update about new med. Left v/m for pt to cb.

## 2013-02-21 ENCOUNTER — Telehealth: Payer: Self-pay

## 2013-02-21 NOTE — Telephone Encounter (Signed)
Pt left v/m that pt was seen 12/21/12 and started Adderall. Pt taking Adderall but "migraines are ridiculous" left v/m for pt to cb for further info.

## 2013-03-01 NOTE — Telephone Encounter (Signed)
Pt said having migraines with nausea once or twice every week; no pattern triggering migraines; sometimes it can be something pt ate, some times pt wakes up with migraines. Adderall neither hurts or helps headaches.Please advise. [facility] [address] Pt also wanted to schedule appt for over 1 week upper stomach pain and nausea, Tums and milk help, pt thinks acid reflux; no fever, CP or SOB. Pt scheduled appt 03/02/13 at 9:15 am with Nicki Reaper NP; if pt condition changes or worsens prior to appt pt will go to [facility].

## 2013-03-01 NOTE — Telephone Encounter (Signed)
Will discuss this at her visit on 11/20.

## 2013-03-02 ENCOUNTER — Ambulatory Visit (INDEPENDENT_AMBULATORY_CARE_PROVIDER_SITE_OTHER): Payer: 59 | Admitting: Internal Medicine

## 2013-03-02 ENCOUNTER — Encounter: Payer: Self-pay | Admitting: Internal Medicine

## 2013-03-02 VITALS — BP 110/70 | HR 80 | Temp 97.7°F | Wt 248.0 lb

## 2013-03-02 DIAGNOSIS — R51 Headache: Secondary | ICD-10-CM

## 2013-03-02 DIAGNOSIS — K219 Gastro-esophageal reflux disease without esophagitis: Secondary | ICD-10-CM | POA: Insufficient documentation

## 2013-03-02 DIAGNOSIS — R110 Nausea: Secondary | ICD-10-CM

## 2013-03-02 MED ORDER — ONDANSETRON HCL 4 MG PO TABS: 4.0000 mg | ORAL_TABLET | Freq: Three times a day (TID) | ORAL | Status: DC | PRN

## 2013-03-02 MED ORDER — PANTOPRAZOLE SODIUM 40 MG PO TBEC: 40.0000 mg | DELAYED_RELEASE_TABLET | Freq: Every day | ORAL | Status: DC

## 2013-03-02 NOTE — Assessment & Plan Note (Signed)
I would say if the fioricet isproviding some relief stick with that She does not want to go back to the headache clinic at this point

## 2013-03-02 NOTE — Progress Notes (Signed)
Subjective:    Patient ID: Erica Wyatt, female    DOB: 1985/11/04, 27 y.o.   MRN: [id number]  HPI  Pt presents to the clinic today for upper abdominal pain and nausea. She thinks it is reflux. This started for about 2 weeks ago. She is not stressed, she has not made any changes in her diet but she has changed her work schedule from night shift to day shift. She describes the pain as a burning in her chest. She does have a slight cough. She has taken tums which have provided good relief. Additionally, she wants to discuss her migraines. She reports that she has chronic headaches, which lead to migraines. [facility]. She does not want to go back there. They advised her to have trigger point injections which she does not want to have. She has failed advil, topomax and imitrex. She is taking fioricet now which does provide some relief.  Review of Systems  Past Medical History  Diagnosis Date  . Asthma     exercise inducted  . Migraines   . Migraine headache   . GERD (gastroesophageal reflux disease)   . Thrombocytopenia complicating pregnancy     Current Outpatient Prescriptions  Medication Sig Dispense Refill  . albuterol (PROVENTIL HFA) 108 (90 BASE) MCG/ACT inhaler Inhale 2 puffs into the lungs every 6 (six) hours as needed. Asthma attacks      . amphetamine-dextroamphetamine (ADDERALL XR) 10 MG 24 hr capsule Take 1 capsule (10 mg total) by mouth every morning.  30 capsule  0  . ibuprofen (ADVIL,MOTRIN) 600 MG tablet Take 1 tablet (600 mg total) by mouth every 6 (six) hours.  30 tablet  5  . [DISCONTINUED] drospirenone-ethinyl estradiol (YAZ) 3-0.02 MG per tablet Take 1 tablet by mouth daily.  30 tablet  11  . [DISCONTINUED] SUMAtriptan (IMITREX) 25 MG tablet Take 1 tablet (25 mg total) by mouth every 2 (two) hours as needed for migraine. Take one tablet by mouth at onset of headache, may repeat in one hour  10 tablet  0   No current  facility-administered medications for this visit.    Allergies  Allergen Reactions  . Amoxicillin     REACTION: childhood reaction,swelling  . Penicillins     REACTION: childhood reaction,swelling  . Topamax [Topiramate]     Suicidal thoughts  . Sulfa Antibiotics Swelling and Rash    Family History  Problem Relation Age of Onset  . Hypertension Mother   . Diabetes Father   . Anesthesia problems Neg Hx   . Hypotension Neg Hx   . Malignant hyperthermia Neg Hx   . Pseudochol deficiency Neg Hx     History   Social History  . Marital Status: Single    Spouse Name: N/A    Number of Children: N/A  . Years of Education: N/A   Occupational History  . Police Officer [employer]   Social History Main Topics  . Smoking status: Never Smoker   . Smokeless tobacco: Never Used  . Alcohol Use: Yes     Comment: socially prior to pregnancy  . Drug Use: No  . Sexual Activity: Yes    Birth Control/ Protection: None   Other Topics Concern  . Not on file   Social History Narrative  . No narrative on file     Constitutional: Denies fever, malaise, fatigue,  or abrupt weight changes.  Respiratory: Denies difficulty breathing, shortness of breath, cough or  sputum production.   Cardiovascular: Denies chest pain, chest tightness, palpitations or swelling in the hands or feet.  Gastrointestinal: Denies bloating, constipation, diarrhea or blood in the stool.   No other specific complaints in a complete review of systems (except as listed in HPI above).     Objective:   Physical Exam  BP 110/70  Pulse 80  Temp(Src) 97.7 F (36.5 C) (Tympanic)  Wt 248 lb (112.492 kg)  SpO2 98% Wt Readings from Last 3 Encounters:  03/02/13 248 lb (112.492 kg)  12/21/12 250 lb (113.399 kg)  12/15/11 283 lb (128.368 kg)    General: Appears her stated age, overweight but well developed, well nourished in NAD. HEENT: Head: normal shape and size; Eyes: sclera white, no icterus,  conjunctiva pink, PERRLA and EOMs intact; Ears: Tm's gray and intact, normal light reflex; Nose: mucosa pink and moist, septum midline; Throat/Mouth: Teeth present, mucosa pink and moist, no exudate, lesions or ulcerations noted.  Cardiovascular: Normal rate and rhythm. S1,S2 noted.  No murmur, rubs or gallops noted. No JVD or BLE edema. No carotid bruits noted. Pulmonary/Chest: Normal effort and positive vesicular breath sounds. No respiratory distress. No wheezes, rales or ronchi noted.  Abdomen: Soft and tender in the epigastric region. Normal bowel sounds, no bruits noted. No distention or masses noted. Liver, spleen and kidneys non palpable.  BMET    Component Value Date/Time   NA 142 09/26/2009 1139   K 4.2 09/26/2009 1139   CL 108 09/26/2009 1139   CO2 30 09/26/2009 1139   GLUCOSE 79 09/26/2009 1139   BUN 14 09/26/2009 1139   CREATININE 0.9 09/26/2009 1139   CALCIUM 9.2 09/26/2009 1139   GFRNONAA 93.37 09/26/2009 1139    Lipid Panel     Component Value Date/Time   CHOL 177 09/26/2009 1139   TRIG 55.0 09/26/2009 1139   HDL 64.30 09/26/2009 1139   CHOLHDL 3 09/26/2009 1139   VLDL 11.0 09/26/2009 1139   LDLCALC 102* 09/26/2009 1139    CBC    Component Value Date/Time   WBC 11.9* 12/16/2011 0520   RBC 3.41* 12/16/2011 0520   HGB 10.1* 12/16/2011 0520   HCT 30.0* 12/16/2011 0520   PLT 105* 12/16/2011 0520   MCV 88.0 12/16/2011 0520   MCH 29.6 12/16/2011 0520   MCHC 33.7 12/16/2011 0520   RDW 13.3 12/16/2011 0520    Hgb A1C No results found for this basename: HGBA1C         Assessment & Plan:

## 2013-03-02 NOTE — Patient Instructions (Signed)
Gastroesophageal Reflux Disease, Adult  Gastroesophageal reflux disease (GERD) happens when acid from your stomach flows up into the esophagus. When acid comes in contact with the esophagus, the acid causes soreness (inflammation) in the esophagus. Over time, GERD may create small holes (ulcers) in the lining of the esophagus.  CAUSES   · Increased body weight. This puts pressure on the stomach, making acid rise from the stomach into the esophagus.  · Smoking. This increases acid production in the stomach.  · Drinking alcohol. This causes decreased pressure in the lower esophageal sphincter (valve or ring of muscle between the esophagus and stomach), allowing acid from the stomach into the esophagus.  · Late evening meals and a full stomach. This increases pressure and acid production in the stomach.  · A malformed lower esophageal sphincter.  Sometimes, no cause is found.  SYMPTOMS   · Burning pain in the lower part of the mid-chest behind the breastbone and in the mid-stomach area. This may occur twice a week or more often.  · Trouble swallowing.  · Sore throat.  · Dry cough.  · Asthma-like symptoms including chest tightness, shortness of breath, or wheezing.  DIAGNOSIS   Your caregiver may be able to diagnose GERD based on your symptoms. In some cases, X-rays and other tests may be done to check for complications or to check the condition of your stomach and esophagus.  TREATMENT   Your caregiver may recommend over-the-counter or prescription medicines to help decrease acid production. Ask your caregiver before starting or adding any new medicines.   HOME CARE INSTRUCTIONS   · Change the factors that you can control. Ask your caregiver for guidance concerning weight loss, quitting smoking, and alcohol consumption.  · Avoid foods and drinks that make your symptoms worse, such as:  · Caffeine or alcoholic drinks.  · Chocolate.  · Peppermint or mint flavorings.  · Garlic and onions.  · Spicy foods.  · Citrus fruits,  such as oranges, lemons, or limes.  · Tomato-based foods such as sauce, chili, salsa, and pizza.  · Fried and fatty foods.  · Avoid lying down for the 3 hours prior to your bedtime or prior to taking a nap.  · Eat small, frequent meals instead of large meals.  · Wear loose-fitting clothing. Do not wear anything tight around your waist that causes pressure on your stomach.  · Raise the head of your bed 6 to 8 inches with wood blocks to help you sleep. Extra pillows will not help.  · Only take over-the-counter or prescription medicines for pain, discomfort, or fever as directed by your caregiver.  · Do not take aspirin, ibuprofen, or other nonsteroidal anti-inflammatory drugs (NSAIDs).  SEEK IMMEDIATE MEDICAL CARE IF:   · You have pain in your arms, neck, jaw, teeth, or back.  · Your pain increases or changes in intensity or duration.  · You develop nausea, vomiting, or sweating (diaphoresis).  · You develop shortness of breath, or you faint.  · Your vomit is green, yellow, black, or looks like coffee grounds or blood.  · Your stool is red, bloody, or black.  These symptoms could be signs of other problems, such as heart disease, gastric bleeding, or esophageal bleeding.  MAKE SURE YOU:   · Understand these instructions.  · Will watch your condition.  · Will get help right away if you are not doing well or get worse.  Document Released: 01/07/2005 Document Revised: 06/22/2011 Document Reviewed: 10/17/2010  [employer]® Patient   Information ©2014 [employer].

## 2013-03-02 NOTE — Telephone Encounter (Signed)
Another phone note was started and pt was seen today.

## 2013-03-02 NOTE — Assessment & Plan Note (Signed)
Will start Protonix daily for the next 2-4 weeks Supplement with TUMS as needed eRx for zofran for nausea

## 2013-03-02 NOTE — Progress Notes (Signed)
Pre-visit discussion using our clinic review tool. No additional management support is needed unless otherwise documented below in the visit note.  

## 2013-03-14 ENCOUNTER — Encounter: Payer: Self-pay | Admitting: Obstetrics

## 2013-03-14 ENCOUNTER — Ambulatory Visit (INDEPENDENT_AMBULATORY_CARE_PROVIDER_SITE_OTHER): Payer: 59 | Admitting: Obstetrics

## 2013-03-14 VITALS — BP 128/79 | HR 87 | Temp 98.0°F | Ht 73.0 in | Wt 252.0 lb

## 2013-03-14 DIAGNOSIS — Z01419 Encounter for gynecological examination (general) (routine) without abnormal findings: Secondary | ICD-10-CM

## 2013-03-14 DIAGNOSIS — Z113 Encounter for screening for infections with a predominantly sexual mode of transmission: Secondary | ICD-10-CM

## 2013-03-14 DIAGNOSIS — Z3202 Encounter for pregnancy test, result negative: Secondary | ICD-10-CM

## 2013-03-14 DIAGNOSIS — Z0000 Encounter for general adult medical examination without abnormal findings: Secondary | ICD-10-CM

## 2013-03-14 LAB — POCT URINALYSIS DIPSTICK
Blood, UA: NEGATIVE
Ketones, UA: NEGATIVE
Spec Grav, UA: 1.02

## 2013-03-14 LAB — POCT URINE PREGNANCY: Preg Test, Ur: NEGATIVE

## 2013-03-14 MED ORDER — PNV PRENATAL PLUS MULTIVITAMIN 27-1 MG PO TABS: 1.0000 | ORAL_TABLET | Freq: Every day | ORAL | Status: DC

## 2013-03-14 NOTE — Progress Notes (Signed)
Subjective:     Erica Wyatt is a 27 y.o. female here for a routine exam.  Current complaints: recurring migraines.  Personal health questionnaire reviewed: yes.   Gynecologic History Patient's last menstrual period was 02/21/2013. Contraception: abstinence Last Pap: 07/17/2010. Results were: normal Last mammogram: N/A  Obstetric History OB History  Gravida Para Term Preterm AB SAB TAB Ectopic Multiple Living  1 1 1  0 0 0 0 0 0 1    # Outcome Date GA Lbr Len/2nd Weight Sex Delivery Anes PTL Lv  1 TRM 12/16/11 [redacted]w[redacted]d 19:20 / 01:14 8 lb 1.5 oz (3.671 kg) M SVD EPI  Y     Comments: normal newborn       The following portions of the patient's history were reviewed and updated as appropriate: allergies, current medications, past family history, past medical history, past social history, past surgical history and problem list.  Review of Systems Pertinent items are noted in HPI.    Objective:    General appearance: alert and no distress Breasts: normal appearance, no masses or tenderness Abdomen: normal findings: soft, non-tender Pelvic: cervix normal in appearance, external genitalia normal, no adnexal masses or tenderness, no cervical motion tenderness, rectovaginal septum normal, uterus normal size, shape, and consistency and vagina normal without discharge    Assessment:    Healthy female exam.    Plan:    Education reviewed: calcium supplements, low fat, low cholesterol diet and self breast exams. Contraception: none. Follow up in: 1 year. PNV's Rx.

## 2013-03-15 LAB — GC/CHLAMYDIA PROBE AMP
CT Probe RNA: NEGATIVE
GC Probe RNA: NEGATIVE

## 2013-03-15 LAB — PAP IG W/ RFLX HPV ASCU

## 2013-03-15 LAB — WET PREP BY MOLECULAR PROBE
Gardnerella vaginalis: NEGATIVE
Trichomonas vaginosis: NEGATIVE

## 2013-06-01 ENCOUNTER — Other Ambulatory Visit: Payer: Self-pay

## 2013-06-01 MED ORDER — AMPHETAMINE-DEXTROAMPHET ER 10 MG PO CP24: 10.0000 mg | ORAL_CAPSULE | ORAL | Status: DC

## 2013-06-01 NOTE — Telephone Encounter (Signed)
Spoke to pt and informed her Rx is available for pickup at the front desk; informed a gov't issued photo id required for pickup 

## 2013-06-01 NOTE — Telephone Encounter (Signed)
Pt request rx adderall. Call when ready for pick up; pt has not taken adderall since Dec but pt wants to restart.

## 2013-06-05 ENCOUNTER — Encounter: Payer: Self-pay | Admitting: Family Medicine

## 2013-10-16 ENCOUNTER — Telehealth: Payer: Self-pay | Admitting: *Deleted

## 2013-10-16 NOTE — Telephone Encounter (Signed)
Patient called stating she had missed a call from Dr. Clearance CootsHarper.  I tried to contact patient and LM on VM to CB.

## 2013-11-08 NOTE — Telephone Encounter (Signed)
No response from patient. Phone call closed.

## 2013-12-06 ENCOUNTER — Other Ambulatory Visit: Payer: Self-pay

## 2013-12-06 MED ORDER — AMPHETAMINE-DEXTROAMPHET ER 10 MG PO CP24: 10.0000 mg | ORAL_CAPSULE | ORAL | Status: DC

## 2013-12-06 NOTE — Telephone Encounter (Signed)
Pt left v/m requesting rx for Adderall. Call when ready for pick up. Pt has hard time remembering to take med, last filled 05/2013. Pt said she has taken daily for last 2 weeks. Pt also scheduled f/u appt on 12/13/13 @ 3:15 for med refills.

## 2013-12-06 NOTE — Telephone Encounter (Signed)
Spoke to Erica Wyatt and informed her Rx is available for pickup at the front desk; Erica Wyatt advised she is unable to have someone pick it up for her per Dr Dayton Martes

## 2013-12-13 ENCOUNTER — Ambulatory Visit: Payer: 59 | Admitting: Family Medicine

## 2013-12-13 DIAGNOSIS — Z0289 Encounter for other administrative examinations: Secondary | ICD-10-CM

## 2013-12-22 ENCOUNTER — Ambulatory Visit (INDEPENDENT_AMBULATORY_CARE_PROVIDER_SITE_OTHER): Payer: 59 | Admitting: Family Medicine

## 2013-12-22 ENCOUNTER — Encounter: Payer: Self-pay | Admitting: Family Medicine

## 2013-12-22 VITALS — BP 118/66 | HR 75 | Temp 98.4°F | Ht 74.0 in | Wt 236.5 lb

## 2013-12-22 DIAGNOSIS — F988 Other specified behavioral and emotional disorders with onset usually occurring in childhood and adolescence: Secondary | ICD-10-CM

## 2013-12-22 DIAGNOSIS — Z23 Encounter for immunization: Secondary | ICD-10-CM

## 2013-12-22 DIAGNOSIS — L708 Other acne: Secondary | ICD-10-CM

## 2013-12-22 DIAGNOSIS — G43909 Migraine, unspecified, not intractable, without status migrainosus: Secondary | ICD-10-CM

## 2013-12-22 MED ORDER — CLINDAMYCIN PHOS-BENZOYL PEROX 1-5 % EX GEL: Freq: Two times a day (BID) | CUTANEOUS | Status: DC

## 2013-12-22 MED ORDER — AMPHETAMINE-DEXTROAMPHET ER 20 MG PO CP24: 20.0000 mg | ORAL_CAPSULE | ORAL | Status: DC

## 2013-12-22 MED ORDER — BUTALBITAL-APAP-CAFFEINE 50-325-40 MG PO TABS: 1.0000 | ORAL_TABLET | Freq: Two times a day (BID) | ORAL | Status: DC | PRN

## 2013-12-22 NOTE — Assessment & Plan Note (Signed)
Well controlled with abortive rx only.

## 2013-12-22 NOTE — Progress Notes (Signed)
Pre visit review using our clinic review tool, if applicable. No additional management support is needed unless otherwise documented below in the visit note. 

## 2013-12-22 NOTE — Progress Notes (Signed)
Subjective:    Patient ID: Erica Wyatt, female    DOB: 06/23/1985, 28 y.o.   MRN: [id number]  HPI  28 yo pleasant female here to discuss:  1.  ADD- diagnosed in elementary school through formal testing, then went through formal testing again in college.  Took strattera for short period of time.    She is having a lot of difficulty concentrating at home and at work.  Has a two year old son and it often takes her a long time to get out of the house in the morning because of all the things she has to do to get him ready. Her colleagues have noticed this too.  We started Adderall 10 mg XL in 12/2012- she has not been refilling it regularly.  She forgets to take it.   When she does remember to take it, only mildly effective.  2.  Migraine HA- [facility].  Topamax gave her suicidal thoughts.  In past, took imitrex but stopped this due to pregnancy. Fiorect seems to work best.  3.  Acne- has noticed that it has worsened since her pregnancy.  Mainly on her forehead and cheeks.  Trying OTC topical acne rx without improvement.  Patient Active Problem List   Diagnosis Date Noted  . Chronic headaches 03/02/2013  . GERD (gastroesophageal reflux disease) 03/02/2013  . ADD (attention deficit disorder) 12/21/2012  . MIGRAINE HEADACHE 09/26/2009  . ACNE, MILD 09/26/2009   Past Medical History  Diagnosis Date  . Asthma     exercise inducted  . Migraines   . Migraine headache   . GERD (gastroesophageal reflux disease)   . Thrombocytopenia complicating pregnancy    Past Surgical History  Procedure Laterality Date  . Wisdom tooth extraction     History  Substance Use Topics  . Smoking status: Never Smoker   . Smokeless tobacco: Never Used  . Alcohol Use: Yes     Comment: socially    Family History  Problem Relation Age of Onset  . Hypertension Mother   . Fibroids Mother   . Hypertension Father   . Anesthesia problems Neg Hx   . Hypotension Neg Hx   . Malignant  hyperthermia Neg Hx   . Pseudochol deficiency Neg Hx   . Heart disease Maternal Grandmother   . Fibroids Maternal Grandmother   . Heart disease Maternal Grandfather   . Cancer Paternal Grandmother   . Heart disease Paternal Grandfather    Allergies  Allergen Reactions  . Amoxicillin     REACTION: childhood reaction,swelling  . Latex Other (See Comments)    Irritation to mucous membranes, especially vaginal tissue  . Penicillins     REACTION: childhood reaction,swelling  . Topamax [Topiramate]     Suicidal thoughts  . Sulfa Antibiotics Swelling and Rash   Current Outpatient Prescriptions on File Prior to Visit  Medication Sig Dispense Refill  . albuterol (PROVENTIL HFA) 108 (90 BASE) MCG/ACT inhaler Inhale 2 puffs into the lungs every 6 (six) hours as needed. Asthma attacks      . amphetamine-dextroamphetamine (ADDERALL XR) 10 MG 24 hr capsule Take 1 capsule (10 mg total) by mouth every morning.  30 capsule  0  . pantoprazole (PROTONIX) 40 MG tablet Take 1 tablet (40 mg total) by mouth daily.  30 tablet  0  . [DISCONTINUED] drospirenone-ethinyl estradiol (YAZ) 3-0.02 MG per tablet Take 1 tablet by mouth daily.  30 tablet  11  . [DISCONTINUED] SUMAtriptan (IMITREX) 25 MG  tablet Take 1 tablet (25 mg total) by mouth every 2 (two) hours as needed for migraine. Take one tablet by mouth at onset of headache, may repeat in one hour  10 tablet  0   No current facility-administered medications on file prior to visit.   The PMH, PSH, Social History, Family History, Medications, and allergies have been reviewed in [facility], and have been updated if relevant.     Review of Systems See HPI No photophobia No other focal neurological issues No rashes Denies insomnia    Objective:   Physical Exam BP 118/66  Pulse 75  Temp(Src) 98.4 F (36.9 C) (Oral)  Ht  (1.88 m)  Wt 236 lb 8 oz (107.276 kg)  BMI 30.35 kg/m2  SpO2 98%  LMP 12/04/2013  General:  Well-developed,well-nourished,in  no acute distress; alert,appropriate and cooperative throughout examination Head:  normocephalic and atraumatic.   Lungs:  Normal respiratory effort, chest expands symmetrically. Lungs are clear to auscultation, no crackles or wheezes. Heart:  Normal rate and regular rhythm. S1 and S2 normal without gallop, murmur, click, rub or other extra sounds. Abdomen:  Bowel sounds positive,abdomen soft and non-tender without masses, organomegaly or hernias noted. Msk:  No deformity or scoliosis noted of thoracic or lumbar spine.   Extremities:  No clubbing, cyanosis, edema, or deformity noted with normal full range of motion of all joints.   Neurologic:  alert & oriented X3 and gait normal.   Skin:  +cystic acne in T zone Psych:  Cognition and judgment appear intact. Alert and cooperative with normal attention span and concentration. No apparent delusions, illusions, hallucinations    Assessment & Plan:

## 2013-12-22 NOTE — Assessment & Plan Note (Signed)
New- Rx sent for benzaclin. She will update me with symptoms.

## 2013-12-22 NOTE — Assessment & Plan Note (Signed)
>  25 minutes spent in face to face time with patient, >50% spent in counselling or coordination of care Will try increased dose of Adderall 20 mg XL daily. She will call me with an update.

## 2013-12-22 NOTE — Patient Instructions (Signed)
Good to see you. Please call me with an update.

## 2014-01-24 ENCOUNTER — Telehealth: Payer: Self-pay

## 2014-01-24 NOTE — Telephone Encounter (Signed)
Pt has been taking Adderall XR 20 mg daily since 12/22/13. Pt still has 15 pills left; pt said she tries to take med daily. Pt said concentration is much better; pts supervisor can see improvement as well. Pt said 1 hr after taking Adderall XR 20 mg upper stomach has nervous or anxious feeling in it. Stomach feels anxious or turns for a few hours before feeling resolves. Pt wants to know will this stomach feeling be something pt has to get used to or what to do. Pt request cb.

## 2014-01-24 NOTE — Telephone Encounter (Signed)
Spoke to pt and advised per Dr Dayton MartesAron. Pt verbally expressed understanding and states that she will give it a little more time to see if s/s subside. Pt states that she will contact office with update.

## 2014-01-24 NOTE — Telephone Encounter (Signed)
For some people, they do get used to it and it goes away.  It is a stimulant and all stimulants have those potential side effects.  We could certainly try a lower dose or try to give it a little more time.

## 2014-02-12 ENCOUNTER — Encounter: Payer: Self-pay | Admitting: Family Medicine

## 2014-02-14 ENCOUNTER — Other Ambulatory Visit: Payer: Self-pay | Admitting: *Deleted

## 2014-02-14 ENCOUNTER — Encounter: Payer: Self-pay | Admitting: Family Medicine

## 2014-02-14 MED ORDER — AMPHETAMINE-DEXTROAMPHET ER 20 MG PO CP24: 20.0000 mg | ORAL_CAPSULE | ORAL | Status: DC

## 2014-02-14 NOTE — Telephone Encounter (Signed)
Spoke to pt and informed her Rx will be available for pickup after 1400 today. Pt advised 3rd party unable to pickup Rx

## 2014-02-23 ENCOUNTER — Emergency Department (HOSPITAL_COMMUNITY)
Admission: EM | Admit: 2014-02-23 | Discharge: 2014-02-24 | Disposition: A | Discharge status: Home / Self Care | Payer: 59 | Attending: Emergency Medicine | Admitting: Emergency Medicine

## 2014-02-23 ENCOUNTER — Encounter (HOSPITAL_COMMUNITY): Payer: Self-pay | Admitting: *Deleted

## 2014-02-23 DIAGNOSIS — Z91040 Latex allergy status: Secondary | ICD-10-CM | POA: Diagnosis not present

## 2014-02-23 DIAGNOSIS — Z79899 Other long term (current) drug therapy: Secondary | ICD-10-CM | POA: Diagnosis not present

## 2014-02-23 DIAGNOSIS — K219 Gastro-esophageal reflux disease without esophagitis: Secondary | ICD-10-CM | POA: Diagnosis not present

## 2014-02-23 DIAGNOSIS — S99911A Unspecified injury of right ankle, initial encounter: Secondary | ICD-10-CM | POA: Diagnosis not present

## 2014-02-23 DIAGNOSIS — J45909 Unspecified asthma, uncomplicated: Secondary | ICD-10-CM | POA: Insufficient documentation

## 2014-02-23 DIAGNOSIS — S8992XA Unspecified injury of left lower leg, initial encounter: Secondary | ICD-10-CM | POA: Insufficient documentation

## 2014-02-23 DIAGNOSIS — Y92410 Unspecified street and highway as the place of occurrence of the external cause: Secondary | ICD-10-CM | POA: Insufficient documentation

## 2014-02-23 DIAGNOSIS — Y9389 Activity, other specified: Secondary | ICD-10-CM | POA: Diagnosis not present

## 2014-02-23 DIAGNOSIS — Y998 Other external cause status: Secondary | ICD-10-CM | POA: Diagnosis not present

## 2014-02-23 DIAGNOSIS — Z880 Allergy status to penicillin: Secondary | ICD-10-CM | POA: Diagnosis not present

## 2014-02-23 DIAGNOSIS — S4992XA Unspecified injury of left shoulder and upper arm, initial encounter: Secondary | ICD-10-CM | POA: Diagnosis not present

## 2014-02-23 DIAGNOSIS — S4991XA Unspecified injury of right shoulder and upper arm, initial encounter: Secondary | ICD-10-CM | POA: Diagnosis not present

## 2014-02-23 DIAGNOSIS — Z8679 Personal history of other diseases of the circulatory system: Secondary | ICD-10-CM | POA: Diagnosis not present

## 2014-02-23 DIAGNOSIS — M25571 Pain in right ankle and joints of right foot: Secondary | ICD-10-CM
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
## 2014-02-24 ENCOUNTER — Emergency Department (HOSPITAL_COMMUNITY): Payer: 59

## 2014-02-24 DIAGNOSIS — S99911A Unspecified injury of right ankle, initial encounter: Secondary | ICD-10-CM | POA: Diagnosis not present
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
## 2014-02-24 NOTE — Discharge Instructions (Signed)

## 2014-03-01 ENCOUNTER — Ambulatory Visit: Payer: 59 | Admitting: Internal Medicine

## 2014-03-01 ENCOUNTER — Ambulatory Visit: Payer: 59 | Admitting: Family Medicine

## 2014-03-02 ENCOUNTER — Encounter: Payer: Self-pay | Admitting: Family Medicine

## 2014-03-02 ENCOUNTER — Ambulatory Visit (INDEPENDENT_AMBULATORY_CARE_PROVIDER_SITE_OTHER): Payer: 59 | Admitting: Family Medicine

## 2014-03-02 VITALS — BP 116/73 | HR 75 | Temp 98.1°F | Ht 74.0 in | Wt 231.2 lb

## 2014-03-02 DIAGNOSIS — S161XXA Strain of muscle, fascia and tendon at neck level, initial encounter: Secondary | ICD-10-CM | POA: Insufficient documentation

## 2014-03-02 DIAGNOSIS — S39012D Strain of muscle, fascia and tendon of lower back, subsequent encounter: Secondary | ICD-10-CM

## 2014-03-02 DIAGNOSIS — S39012A Strain of muscle, fascia and tendon of lower back, initial encounter: Secondary | ICD-10-CM | POA: Insufficient documentation

## 2014-03-02 DIAGNOSIS — S161XXD Strain of muscle, fascia and tendon at neck level, subsequent encounter: Secondary | ICD-10-CM

## 2014-03-02 HISTORY — DX: Strain of muscle, fascia and tendon of lower back, initial encounter: S39.012A

## 2014-03-02 HISTORY — DX: Strain of muscle, fascia and tendon at neck level, initial encounter: S16.1XXA

## 2014-03-02 MED ORDER — ALBUTEROL SULFATE HFA 108 (90 BASE) MCG/ACT IN AERS: 2.0000 | INHALATION_SPRAY | Freq: Four times a day (QID) | RESPIRATORY_TRACT | Status: AC | PRN

## 2014-03-02 MED ORDER — CYCLOBENZAPRINE HCL 10 MG PO TABS: 10.0000 mg | ORAL_TABLET | Freq: Every evening | ORAL | Status: DC | PRN

## 2014-03-02 NOTE — Progress Notes (Signed)
Pre visit review using our clinic review tool, if applicable. No additional management support is needed unless otherwise documented below in the visit note. 
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
## 2014-03-02 NOTE — Assessment & Plan Note (Signed)
NSAIDS, heat, massage, home PT, muscle relaxant.

## 2014-03-02 NOTE — Assessment & Plan Note (Signed)
NSAIDs, muscle relaxant, heat, massage and home PT.

## 2014-03-02 NOTE — Patient Instructions (Signed)
Heat on neck and low back. Start home physical therapy. Massage. Use muscle relaxant at night  and ibuprofen 800 mg every 8 hours  during the day.

## 2014-03-06 ENCOUNTER — Ambulatory Visit: Payer: 59 | Admitting: Family Medicine

## 2014-03-20 ENCOUNTER — Encounter: Payer: Self-pay | Admitting: Obstetrics

## 2014-03-20 ENCOUNTER — Ambulatory Visit (INDEPENDENT_AMBULATORY_CARE_PROVIDER_SITE_OTHER): Payer: 59 | Admitting: Obstetrics

## 2014-03-20 VITALS — BP 113/73 | HR 85 | Ht 76.0 in

## 2014-03-20 DIAGNOSIS — Z3002 Counseling and instruction in natural family planning to avoid pregnancy: Secondary | ICD-10-CM

## 2014-03-20 DIAGNOSIS — Z01419 Encounter for gynecological examination (general) (routine) without abnormal findings: Secondary | ICD-10-CM

## 2014-03-21 ENCOUNTER — Encounter: Payer: Self-pay | Admitting: Obstetrics

## 2014-03-21 LAB — PAP IG W/ RFLX HPV ASCU

## 2014-03-21 NOTE — Progress Notes (Signed)
Subjective:     Erica EpleySenaria V Wyatt is a 28 y.o. female here for a routine exam.  Current complaints: none.    Personal health questionnaire:  Is patient Ashkenazi Jewish, have a family history of breast and/or ovarian cancer: no Is there a family history of uterine cancer diagnosed at age < 2950, gastrointestinal cancer, urinary tract cancer, family member who is a Personnel officerLynch syndrome-associated carrier: no Is the patient overweight and hypertensive, family history of diabetes, personal history of gestational diabetes or PCOS: no Is patient over 3355, have PCOS,  family history of premature CHD under age 29, diabetes, smoke, have hypertension or peripheral artery disease:  no At any time, has a partner hit, kicked or otherwise hurt or frightened you?: no Over the past 2 weeks, have you felt down, depressed or hopeless?: no Over the past 2 weeks, have you felt little interest or pleasure in doing things?:no   Gynecologic History Patient's last menstrual period was 03/11/2014. Contraception: abstinence Last Pap: 2014. Results were: normal Last mammogram: n/a. Results were: n/a  Obstetric History OB History  Gravida Para Term Preterm AB SAB TAB Ectopic Multiple Living  1 1 1  0 0 0 0 0 0 1    # Outcome Date GA Lbr Len/2nd Weight Sex Delivery Anes PTL Lv  1 Term 12/16/11 [id number] 19:20 / 01:14 8 lb 1.5 oz (3.671 kg) M Vag-Spont EPI  Y     Comments: normal newborn      Past Medical History  Diagnosis Date  . Asthma     exercise inducted  . Migraines   . Migraine headache   . GERD (gastroesophageal reflux disease)   . Thrombocytopenia complicating pregnancy     Past Surgical History  Procedure Laterality Date  . Wisdom tooth extraction      Current outpatient prescriptions: albuterol (PROVENTIL HFA) 108 (90 BASE) MCG/ACT inhaler, Inhale 2 puffs into the lungs every 6 (six) hours as needed. Asthma attacks, Disp: 1 Inhaler, Rfl: 0;  amphetamine-dextroamphetamine (ADDERALL XR) 20 MG 24 hr capsule,  Take 1 capsule (20 mg total) by mouth every morning., Disp: 30 capsule, Rfl: 0 butalbital-acetaminophen-caffeine (FIORICET, ESGIC) 50-325-40 MG per tablet, Take 1 tablet by mouth 2 (two) times daily as needed for headache., Disp: 14 tablet, Rfl: 0;  cyclobenzaprine (FLEXERIL) 10 MG tablet, Take 1 tablet (10 mg total) by mouth at bedtime as needed for muscle spasms., Disp: 15 tablet, Rfl: 0;  [DISCONTINUED] drospirenone-ethinyl estradiol (YAZ) 3-0.02 MG per tablet, Take 1 tablet by mouth daily., Disp: 30 tablet, Rfl: 11 [DISCONTINUED] SUMAtriptan (IMITREX) 25 MG tablet, Take 1 tablet (25 mg total) by mouth every 2 (two) hours as needed for migraine. Take one tablet by mouth at onset of headache, may repeat in one hour, Disp: 10 tablet, Rfl: 0 Allergies  Allergen Reactions  . Amoxicillin     REACTION: childhood reaction,swelling  . Latex Other (See Comments)    Irritation to mucous membranes, especially vaginal tissue  . Penicillins     REACTION: childhood reaction,swelling  . Topamax [Topiramate]     Suicidal thoughts  . Sulfa Antibiotics Swelling and Rash    History  Substance Use Topics  . Smoking status: Never Smoker   . Smokeless tobacco: Never Used  . Alcohol Use: Yes     Comment: socially     Family History  Problem Relation Age of Onset  . Hypertension Mother   . Fibroids Mother   . Hypertension Father   . Anesthesia problems Neg Hx   .  Hypotension Neg Hx   . Malignant hyperthermia Neg Hx   . Pseudochol deficiency Neg Hx   . Heart disease Maternal Grandmother   . Fibroids Maternal Grandmother   . Heart disease Maternal Grandfather   . Cancer Paternal Grandmother   . Heart disease Paternal Grandfather       Review of Systems  Constitutional: negative for fatigue and weight loss Respiratory: negative for cough and wheezing Cardiovascular: negative for chest pain, fatigue and palpitations Gastrointestinal: negative for abdominal pain and change in bowel  habits Musculoskeletal:negative for myalgias Neurological: negative for gait problems and tremors Behavioral/Psych: negative for abusive relationship, depression Endocrine: negative for temperature intolerance   Genitourinary:negative for abnormal menstrual periods, genital lesions, hot flashes, sexual problems and vaginal discharge Integument/breast: negative for breast lump, breast tenderness, nipple discharge and skin lesion(s)    Objective:       BP 113/73 mmHg  Pulse 85  Ht 6\' 4"  (1.93 m)  LMP 03/11/2014 General:   alert  Skin:   no rash or abnormalities  Lungs:   clear to auscultation bilaterally  Heart:   regular rate and rhythm, S1, S2 normal, no murmur, click, rub or gallop  Breasts:   normal without suspicious masses, skin or nipple changes or axillary nodes  Abdomen:  normal findings: no organomegaly, soft, non-tender and no hernia  Pelvis:  External genitalia: normal general appearance Urinary system: urethral meatus normal and bladder without fullness, nontender Vaginal: normal without tenderness, induration or masses Cervix: normal appearance Adnexa: normal bimanual exam Uterus: anteverted and non-tender, normal size   Lab Review Urine pregnancy test Labs reviewed yes Radiologic studies reviewed no    Assessment:    Healthy female exam.    Contraceptive counseling.   Plan:    Education reviewed: low fat, low cholesterol diet, safe sex/STD prevention, self breast exams and weight bearing exercise. Contraception: Options discussed.. Follow up in: 1 year.   No orders of the defined types were placed in this encounter.   Orders Placed This Encounter  Procedures  . SureSwab, Vaginosis/Vaginitis Plus

## 2014-03-23 LAB — SURESWAB, VAGINOSIS/VAGINITIS PLUS
Atopobium vaginae: NOT DETECTED Log (cells/mL)
C. PARAPSILOSIS, DNA: NOT DETECTED
C. TRACHOMATIS RNA, TMA: NOT DETECTED
C. TROPICALIS, DNA: NOT DETECTED
C. albicans, DNA: NOT DETECTED
C. glabrata, DNA: NOT DETECTED
Gardnerella vaginalis: NOT DETECTED Log (cells/mL)
LACTOBACILLUS SPECIES: NOT DETECTED Log (cells/mL)
MEGASPHAERA SPECIES: NOT DETECTED Log (cells/mL)
N. gonorrhoeae RNA, TMA: NOT DETECTED
T. vaginalis RNA, QL TMA: NOT DETECTED

## 2014-05-31 ENCOUNTER — Encounter (HOSPITAL_COMMUNITY): Payer: Self-pay | Admitting: Emergency Medicine

## 2014-05-31 ENCOUNTER — Emergency Department (HOSPITAL_COMMUNITY)
Admission: EM | Admit: 2014-05-31 | Discharge: 2014-05-31 | Disposition: A | Discharge status: Home / Self Care | Payer: No Typology Code available for payment source | Attending: Emergency Medicine | Admitting: Emergency Medicine

## 2014-05-31 DIAGNOSIS — Y93I9 Activity, other involving external motion: Secondary | ICD-10-CM | POA: Diagnosis not present

## 2014-05-31 DIAGNOSIS — Y92488 Other paved roadways as the place of occurrence of the external cause: Secondary | ICD-10-CM | POA: Insufficient documentation

## 2014-05-31 DIAGNOSIS — S339XXA Sprain of unspecified parts of lumbar spine and pelvis, initial encounter: Secondary | ICD-10-CM | POA: Insufficient documentation

## 2014-05-31 DIAGNOSIS — Y999 Unspecified external cause status: Secondary | ICD-10-CM | POA: Insufficient documentation

## 2014-05-31 DIAGNOSIS — M25562 Pain in left knee: Secondary | ICD-10-CM

## 2014-05-31 DIAGNOSIS — S335XXA Sprain of ligaments of lumbar spine, initial encounter: Secondary | ICD-10-CM

## 2014-05-31 MED ORDER — IBUPROFEN 800 MG PO TABS: 800.0000 mg | ORAL_TABLET | Freq: Three times a day (TID) | ORAL | Status: DC

## 2014-05-31 MED ORDER — CYCLOBENZAPRINE HCL 10 MG PO TABS: 10.0000 mg | ORAL_TABLET | Freq: Two times a day (BID) | ORAL | Status: DC | PRN
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
## 2014-06-11 ENCOUNTER — Other Ambulatory Visit: Payer: Self-pay | Admitting: *Deleted

## 2014-06-11 MED ORDER — AMPHETAMINE-DEXTROAMPHET ER 20 MG PO CP24: 20.0000 mg | ORAL_CAPSULE | ORAL | Status: DC

## 2014-06-11 MED ORDER — BUTALBITAL-APAP-CAFFEINE 50-325-40 MG PO TABS: 1.0000 | ORAL_TABLET | Freq: Two times a day (BID) | ORAL | Status: DC | PRN

## 2014-06-11 NOTE — Telephone Encounter (Signed)
Patient left a voicemail requesting a refill on her Adderall and medication for her migraines. Patient stated that the medication for her migraines was prescribed by the doctor at urgent care and he is no longer with [facility]. Call when script is ready for pickup.

## 2014-06-11 NOTE — Telephone Encounter (Signed)
Spoke to pt who states that she received a letter indicating the prescribing Dr was no longer in her network and that she would need to receive refills alternatively.

## 2014-06-11 NOTE — Telephone Encounter (Signed)
Marcelline MatesWaynetta, please get more information on this request please.

## 2014-06-11 NOTE — Telephone Encounter (Signed)
Spoke to pt and informed her Rx is available for pickup from the front desk 

## 2014-06-11 NOTE — Addendum Note (Signed)
Addended by: Desmond DikeKNIGHT, Louanne Calvillo H on: 06/11/2014 12:38 PM   Modules accepted: Orders

## 2014-09-03 ENCOUNTER — Encounter: Payer: Self-pay | Admitting: Obstetrics

## 2014-09-03 ENCOUNTER — Ambulatory Visit (INDEPENDENT_AMBULATORY_CARE_PROVIDER_SITE_OTHER): Payer: 59 | Admitting: Obstetrics

## 2014-09-03 VITALS — BP 126/69 | HR 77 | Temp 98.3°F | Wt 227.0 lb

## 2014-09-03 DIAGNOSIS — R399 Unspecified symptoms and signs involving the genitourinary system: Secondary | ICD-10-CM | POA: Diagnosis not present

## 2014-09-03 DIAGNOSIS — B9689 Other specified bacterial agents as the cause of diseases classified elsewhere: Secondary | ICD-10-CM

## 2014-09-03 DIAGNOSIS — N760 Acute vaginitis: Secondary | ICD-10-CM

## 2014-09-03 DIAGNOSIS — A499 Bacterial infection, unspecified: Secondary | ICD-10-CM

## 2014-09-03 LAB — POCT URINALYSIS DIPSTICK
BILIRUBIN UA: NEGATIVE
Blood, UA: NEGATIVE
GLUCOSE UA: NEGATIVE
KETONES UA: NEGATIVE
NITRITE UA: NEGATIVE
Protein, UA: NEGATIVE
Spec Grav, UA: 1.015
UROBILINOGEN UA: NEGATIVE
pH, UA: 6

## 2014-09-03 MED ORDER — TINIDAZOLE 500 MG PO TABS: 1000.0000 mg | ORAL_TABLET | Freq: Every day | ORAL | Status: DC

## 2014-09-03 NOTE — Progress Notes (Signed)
Patient ID: Erica Wyatt, female   DOB: 06/14/1985, 29 y.o.   MRN: [id number]  Chief Complaint  Patient presents with  . Vaginitis    HPI Erica Wyatt is a 29 y.o. female.  Malodorous vaginal discharge.  HPI  Past Medical History  Diagnosis Date  . Asthma     exercise inducted  . Migraines   . Migraine headache   . GERD (gastroesophageal reflux disease)   . Thrombocytopenia complicating pregnancy     Past Surgical History  Procedure Laterality Date  . Wisdom tooth extraction      Family History  Problem Relation Age of Onset  . Hypertension Mother   . Fibroids Mother   . Hypertension Father   . Anesthesia problems Neg Hx   . Hypotension Neg Hx   . Malignant hyperthermia Neg Hx   . Pseudochol deficiency Neg Hx   . Heart disease Maternal Grandmother   . Fibroids Maternal Grandmother   . Heart disease Maternal Grandfather   . Cancer Paternal Grandmother   . Heart disease Paternal Grandfather     Social History History  Substance Use Topics  . Smoking status: Never Smoker   . Smokeless tobacco: Never Used  . Alcohol Use: 0.0 oz/week    0 Standard drinks or equivalent per week     Comment: socially     Allergies  Allergen Reactions  . Amoxicillin     REACTION: childhood reaction,swelling  . Latex Other (See Comments)    Irritation to mucous membranes, especially vaginal tissue  . Penicillins     REACTION: childhood reaction,swelling  . Topamax [Topiramate]     Suicidal thoughts  . Sulfa Antibiotics Swelling and Rash    Current Outpatient Prescriptions  Medication Sig Dispense Refill  . albuterol (PROVENTIL HFA) 108 (90 BASE) MCG/ACT inhaler Inhale 2 puffs into the lungs every 6 (six) hours as needed. Asthma attacks 1 Inhaler 0  . amphetamine-dextroamphetamine (ADDERALL XR) 20 MG 24 hr capsule Take 1 capsule (20 mg total) by mouth every morning. 30 capsule 0  . ibuprofen (ADVIL,MOTRIN) 800 MG tablet Take 1 tablet (800 mg total) by mouth 3 (three)  times daily. 21 tablet 0  . butalbital-acetaminophen-caffeine (FIORICET, ESGIC) 50-325-40 MG per tablet Take 1 tablet by mouth 2 (two) times daily as needed for headache. (Patient not taking: Reported on 09/03/2014) 14 tablet 0  . cyclobenzaprine (FLEXERIL) 10 MG tablet Take 1 tablet (10 mg total) by mouth at bedtime as needed for muscle spasms. (Patient not taking: Reported on 09/03/2014) 15 tablet 0  . cyclobenzaprine (FLEXERIL) 10 MG tablet Take 1 tablet (10 mg total) by mouth 2 (two) times daily as needed for muscle spasms. (Patient not taking: Reported on 09/03/2014) 20 tablet 0  . tinidazole (TINDAMAX) 500 MG tablet Take 2 tablets (1,000 mg total) by mouth daily with breakfast. 10 tablet 2  . [DISCONTINUED] drospirenone-ethinyl estradiol (YAZ) 3-0.02 MG per tablet Take 1 tablet by mouth daily. 30 tablet 11  . [DISCONTINUED] SUMAtriptan (IMITREX) 25 MG tablet Take 1 tablet (25 mg total) by mouth every 2 (two) hours as needed for migraine. Take one tablet by mouth at onset of headache, may repeat in one hour 10 tablet 0   No current facility-administered medications for this visit.    Review of Systems Review of Systems Constitutional: negative for fatigue and weight loss Respiratory: negative for cough and wheezing Cardiovascular: negative for chest pain, fatigue and palpitations Gastrointestinal: negative for abdominal pain and change in bowel habits  Genitourinary:negative Integument/breast: negative for nipple discharge Musculoskeletal:negative for myalgias Neurological: negative for gait problems and tremors Behavioral/Psych: negative for abusive relationship, depression Endocrine: negative for temperature intolerance     Blood pressure 126/69, pulse 77, temperature 98.3 F (36.8 C), weight 227 lb (102.967 kg), last menstrual period 08/15/2014.  Physical Exam Physical Exam General:   alert  Skin:   no rash or abnormalities  Lungs:   clear to auscultation bilaterally  Heart:    regular rate and rhythm, S1, S2 normal, no murmur, click, rub or gallop  Breasts:   normal without suspicious masses, skin or nipple changes or axillary nodes  Abdomen:  normal findings: no organomegaly, soft, non-tender and no hernia  Pelvis:  External genitalia: normal general appearance Urinary system: urethral meatus normal and bladder without fullness, nontender Vaginal: normal without tenderness, induration or masses Cervix: normal appearance Adnexa: normal bimanual exam Uterus: anteverted and non-tender, normal size      Data Reviewed Labs  Assessment     BV     Plan    Tindamax dispensed.   Orders Placed This Encounter  Procedures  . Urine culture  . SureSwab, Vaginosis/Vaginitis Plus  . POCT urinalysis dipstick   Meds ordered this encounter  Medications  . tinidazole (TINDAMAX) 500 MG tablet    Sig: Take 2 tablets (1,000 mg total) by mouth daily with breakfast.    Dispense:  10 tablet    Refill:  2

## 2014-09-05 LAB — URINE CULTURE: Colony Count: 40000

## 2014-09-06 LAB — SURESWAB, VAGINOSIS/VAGINITIS PLUS
Atopobium vaginae: 7.5 Log (cells/mL)
C. PARAPSILOSIS, DNA: NOT DETECTED
C. TROPICALIS, DNA: NOT DETECTED
C. albicans, DNA: NOT DETECTED
C. glabrata, DNA: NOT DETECTED
C. trachomatis RNA, TMA: NOT DETECTED
Gardnerella vaginalis: >8 Log (cells/mL)
LACTOBACILLUS SPECIES: NOT DETECTED Log (cells/mL)
MEGASPHAERA SPECIES: 7.6 Log (cells/mL)
N. gonorrhoeae RNA, TMA: NOT DETECTED
T. vaginalis RNA, QL TMA: NOT DETECTED

## 2014-09-07 ENCOUNTER — Other Ambulatory Visit: Payer: Self-pay | Admitting: Obstetrics

## 2014-10-29 ENCOUNTER — Other Ambulatory Visit: Payer: Self-pay

## 2014-10-29 NOTE — Telephone Encounter (Signed)
Pt left v/m requesting rx for Adderall. Call when ready for pick up. rx last printed # 30 on 06/11/14 and pt last seen 12/22/13.

## 2014-10-30 MED ORDER — AMPHETAMINE-DEXTROAMPHET ER 20 MG PO CP24: 20.0000 mg | ORAL_CAPSULE | ORAL | Status: DC

## 2014-10-30 NOTE — Telephone Encounter (Signed)
Spoke to pt and informed her Rx is available for pick up at the front desk 

## 2014-10-30 NOTE — Telephone Encounter (Signed)
Pt has not been on med since 05/2014. Last f/u 12/2013. Is she needing a f/u appt to restart?

## 2014-10-30 NOTE — Telephone Encounter (Signed)
Ok to approve one time only.  Needs to be seen for further refills.

## 2014-11-15 ENCOUNTER — Encounter: Payer: Self-pay | Admitting: Family Medicine

## 2014-11-28 ENCOUNTER — Telehealth: Payer: Self-pay | Admitting: *Deleted

## 2014-11-28 MED ORDER — METRONIDAZOLE 0.75 % VA GEL: 1.0000 | Freq: Every day | VAGINAL | Status: DC

## 2014-11-28 NOTE — Telephone Encounter (Signed)
Patient states she is having a discharge with odor. Patient was requesting refill on the same medication as the samples she was given in the office last time she was treated for BV. Patient was given Tindamax. Patient advised her insurance does not cover that prescription and she could get an alternative prescription that was covered by her insurance. Patient prefers to have a gel. Per nursing protocol prescription for Metrogel sent to the pharmacy.

## 2014-12-06 ENCOUNTER — Encounter: Payer: Self-pay | Admitting: Family Medicine

## 2014-12-28 ENCOUNTER — Telehealth: Payer: Self-pay | Admitting: *Deleted

## 2014-12-28 DIAGNOSIS — B9689 Other specified bacterial agents as the cause of diseases classified elsewhere: Secondary | ICD-10-CM

## 2014-12-28 DIAGNOSIS — N760 Acute vaginitis: Principal | ICD-10-CM

## 2014-12-28 MED ORDER — METRONIDAZOLE 500 MG PO TABS: 500.0000 mg | ORAL_TABLET | Freq: Two times a day (BID) | ORAL | Status: DC

## 2014-12-28 NOTE — Telephone Encounter (Signed)
Patient states the Metrogel did not work to clear up all the BV and she wants an oral treatment- OK per Dr Clearance Coots- but patient needs appointment if she doesn't get better. 1:34 Patient notified per VM.

## 2015-03-18 ENCOUNTER — Other Ambulatory Visit: Payer: Self-pay | Admitting: Obstetrics

## 2015-03-18 DIAGNOSIS — Z30011 Encounter for initial prescription of contraceptive pills: Secondary | ICD-10-CM

## 2015-03-18 DIAGNOSIS — L700 Acne vulgaris: Secondary | ICD-10-CM

## 2015-03-18 MED ORDER — NORGESTIMATE-ETH ESTRADIOL 0.25-35 MG-MCG PO TABS: 1.0000 | ORAL_TABLET | Freq: Every day | ORAL | Status: DC

## 2015-03-18 MED ORDER — CLINDAMYCIN PHOS-BENZOYL PEROX 1-5 % EX GEL: Freq: Two times a day (BID) | CUTANEOUS | Status: DC

## 2015-03-20 ENCOUNTER — Other Ambulatory Visit: Payer: Self-pay | Admitting: Obstetrics

## 2015-03-20 DIAGNOSIS — L700 Acne vulgaris: Secondary | ICD-10-CM

## 2015-03-20 MED ORDER — CLINDAMYCIN PHOSPHATE 1 % EX LOTN: TOPICAL_LOTION | Freq: Two times a day (BID) | CUTANEOUS | Status: DC

## 2015-05-14 ENCOUNTER — Encounter: Payer: Self-pay | Admitting: Podiatry

## 2015-05-14 ENCOUNTER — Ambulatory Visit (INDEPENDENT_AMBULATORY_CARE_PROVIDER_SITE_OTHER): Payer: 59

## 2015-05-14 ENCOUNTER — Ambulatory Visit (INDEPENDENT_AMBULATORY_CARE_PROVIDER_SITE_OTHER): Payer: 59 | Admitting: Podiatry

## 2015-05-14 VITALS — BP 106/71 | HR 84 | Resp 16

## 2015-05-14 DIAGNOSIS — L603 Nail dystrophy: Secondary | ICD-10-CM | POA: Diagnosis not present

## 2015-05-14 DIAGNOSIS — M722 Plantar fascial fibromatosis: Secondary | ICD-10-CM | POA: Diagnosis not present

## 2015-05-14 MED ORDER — METHYLPREDNISOLONE 4 MG PO TBPK: ORAL_TABLET | ORAL | Status: DC

## 2015-05-14 MED ORDER — MELOXICAM 15 MG PO TABS: 15.0000 mg | ORAL_TABLET | Freq: Every day | ORAL | Status: DC

## 2015-05-14 NOTE — Progress Notes (Signed)
   Subjective:    Patient ID: Erica Wyatt, female    DOB: [birth year], 30 y.o.   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 She states that she has had a problem the heel pain for the past few months since playing a pickup basketball game. She states the mornings are particularly bad anytime that she has to be on her feet for long period of time.    Review of Systems  Respiratory: Positive for apnea.   Neurological: Positive for headaches.  All other systems reviewed and are negative.      Objective:   Physical Exam : vital signs are stable she is alert and oriented 3 no apparent distress. Pulses are strongly palpable. Neurologic sensorium is intact per Semmes-Weinstein monofilament. Deep tendon reflexes are intact. Muscle strength is 5 over 5 dorsiflexion plantar flexors and inverters everters all intrinsic musculature is intact. Orthopedic evaluation of his trace pes planus bilateral. She has pain on palpation medial calcaneal tubercles bilateral. Cutaneous evaluation Mr. Is supple well-hydrated cutis no erythema edema saline as drainage or odor mild reactive hyperkeratosis to the forefoot bilateral. 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        Assessment & Plan:   assessment: Pes planus with plantar fasciitis bilateral. Nail dystrophy.  Plan: Discussed etiology pathology conservative versus surgical therapies. Injected the bilateral heels today with Kenalog and local anesthetic. Prescribed Medrol Dosepak to be followed by meloxicam. Placed her plantar fascial braces and a night splint. Discussed appropriate shoe gear stretching exercises ice therapy as she modifications. We also scanned her fourth set of orthotics.

## 2015-05-14 NOTE — Patient Instructions (Signed)

## 2015-06-03 ENCOUNTER — Other Ambulatory Visit: Payer: Self-pay | Admitting: Obstetrics

## 2015-06-04 ENCOUNTER — Telehealth: Payer: Self-pay | Admitting: *Deleted

## 2015-06-04 ENCOUNTER — Encounter (INDEPENDENT_AMBULATORY_CARE_PROVIDER_SITE_OTHER): Payer: 59 | Admitting: Podiatry

## 2015-06-04 NOTE — Telephone Encounter (Signed)
Patient contacted the office requesting a call back.  Attempted to contact the patient and left message for patient to contact the office.

## 2015-06-04 NOTE — Progress Notes (Signed)
This encounter was created in error - please disregard.

## 2015-06-11 ENCOUNTER — Encounter: Payer: Self-pay | Admitting: Podiatry

## 2015-06-11 ENCOUNTER — Ambulatory Visit (INDEPENDENT_AMBULATORY_CARE_PROVIDER_SITE_OTHER): Payer: 59 | Admitting: Podiatry

## 2015-06-11 VITALS — BP 101/66 | HR 77 | Resp 16

## 2015-06-11 DIAGNOSIS — M722 Plantar fascial fibromatosis: Secondary | ICD-10-CM | POA: Diagnosis not present

## 2015-06-11 MED ORDER — KETOCONAZOLE 2 % EX CREA: 1.0000 "application " | TOPICAL_CREAM | Freq: Two times a day (BID) | CUTANEOUS | Status: DC

## 2015-06-11 NOTE — Patient Instructions (Signed)

## 2015-06-11 NOTE — Progress Notes (Signed)
   Subjective:    Patient ID: Erica Wyatt, female    DOB: 1986-01-04, 30 y.o.   MRN: [id number]  HPI: She presents today for follow-up of plantar fasciitis bilateral. She states that her feet approximately 90-95% improved and continues on [employer]ner therapies including plantar fascial brace night splint and nonsteroidals. She states that she does not take a nonsteroidal on a regular basis area PUO on 06/11/2015   Review of Systems     Objective:   Physical Exam: Vital signs are stable she is alert and oriented 3 she has no reproducible pain on palpation medial continued tubercles bilateral. Pulses are strongly palpable.      Assessment & Plan:  Resolving plantar fasciitis bilateral 90-95% improved.  Plan: Dispensed orthotics today with both oral and written home-going instructions. I encouraged her to continue the meloxicam on a regular basis taking it daily. I suggested that she do this until she is 1 month past being well with her plantar fasciitis. Also encouraged her to continue all other conservative therapies.

## 2015-07-05 NOTE — Telephone Encounter (Signed)
No return call to the office. Call to be re-filed.  

## 2015-07-09 ENCOUNTER — Encounter (INDEPENDENT_AMBULATORY_CARE_PROVIDER_SITE_OTHER): Payer: 59 | Admitting: Podiatry

## 2015-07-09 NOTE — Progress Notes (Signed)
This encounter was created in error - please disregard.

## 2015-10-09 ENCOUNTER — Telehealth (HOSPITAL_COMMUNITY): Payer: Self-pay | Admitting: *Deleted

## 2015-10-24 NOTE — Telephone Encounter (Signed)
See telephone note for this encounter.

## 2015-11-11 ENCOUNTER — Ambulatory Visit (INDEPENDENT_AMBULATORY_CARE_PROVIDER_SITE_OTHER): Payer: 59 | Admitting: Obstetrics

## 2015-11-11 ENCOUNTER — Encounter: Payer: Self-pay | Admitting: Obstetrics

## 2015-11-11 VITALS — BP 143/89 | HR 81 | Temp 98.6°F | Resp 16 | Wt 247.0 lb

## 2015-11-11 DIAGNOSIS — Z1389 Encounter for screening for other disorder: Secondary | ICD-10-CM | POA: Diagnosis not present

## 2015-11-11 DIAGNOSIS — Z01419 Encounter for gynecological examination (general) (routine) without abnormal findings: Secondary | ICD-10-CM | POA: Diagnosis not present

## 2015-11-11 DIAGNOSIS — Z113 Encounter for screening for infections with a predominantly sexual mode of transmission: Secondary | ICD-10-CM

## 2015-11-11 DIAGNOSIS — Z3041 Encounter for surveillance of contraceptive pills: Secondary | ICD-10-CM

## 2015-11-11 DIAGNOSIS — N760 Acute vaginitis: Secondary | ICD-10-CM

## 2015-11-11 DIAGNOSIS — Z124 Encounter for screening for malignant neoplasm of cervix: Secondary | ICD-10-CM

## 2015-11-11 LAB — POCT URINALYSIS DIPSTICK
BILIRUBIN UA: NEGATIVE
Blood, UA: NEGATIVE
GLUCOSE UA: NEGATIVE
Ketones, UA: NEGATIVE
NITRITE UA: NEGATIVE
Protein, UA: NEGATIVE
SPEC GRAV UA: 1.02
UROBILINOGEN UA: NEGATIVE
pH, UA: 5

## 2015-11-11 NOTE — Progress Notes (Deleted)
Patient ID: Erica Wyatt, female   DOB: 19-Feb-1986, 30 y.o.   MRN: [id number]   Subjective:        Erica Wyatt is a 30 y.o. female here for a routine exam.  Current complaints: None.    Personal health questionnaire:  Is patient Ashkenazi Jewish, have a family history of breast and/or ovarian cancer: no Is there a family history of uterine cancer diagnosed at age < 60, gastrointestinal cancer, urinary tract cancer, family member who is a Personnel officer syndrome-associated carrier: no Is the patient overweight and hypertensive, family history of diabetes, personal history of gestational diabetes, preeclampsia or PCOS: no Is patient over 65, have PCOS,  family history of premature CHD under age 62, diabetes, smoke, have hypertension or peripheral artery disease:  no At any time, has a partner hit, kicked or otherwise hurt or frightened you?: no Over the past 2 weeks, have you felt down, depressed or hopeless?: no Over the past 2 weeks, have you felt little interest or pleasure in doing things?:no   Gynecologic History Patient's last menstrual period was 10/14/2015. Contraception: OCP (estrogen/progesterone) Last Pap: 2015. Results were: normal Last mammogram: n/a. Results were: n/a  Obstetric History OB History  Gravida Para Term Preterm AB Living  1 1 1  0 0 1  SAB TAB Ectopic Multiple Live Births  0 0 0 0 1    # Outcome Date GA Lbr Len/2nd Weight Sex Delivery Anes PTL Lv  1 Term 12/16/11 [redacted]w[redacted]d 19:20 / 01:14 8 lb 1.5 oz (3.671 kg) M Vag-Spont EPI  LIV     Birth Comments: normal newborn      Past Medical History:  Diagnosis Date  . Asthma    exercise inducted  . GERD (gastroesophageal reflux disease)   . Migraine headache   . Migraines   . Thrombocytopenia complicating pregnancy [facility])     Past Surgical History:  Procedure Laterality Date  . WISDOM TOOTH EXTRACTION       Current Outpatient Prescriptions:  .  albuterol (PROVENTIL HFA) 108 (90 BASE) MCG/ACT inhaler, Inhale  2 puffs into the lungs every 6 (six) hours as needed. Asthma attacks, Disp: 1 Inhaler, Rfl: 0 .  meloxicam (MOBIC) 15 MG tablet, Take 1 tablet (15 mg total) by mouth daily., Disp: 30 tablet, Rfl: 3 .  norgestimate-ethinyl estradiol (ORTHO-CYCLEN,SPRINTEC,PREVIFEM) 0.25-35 MG-MCG tablet, Take 1 tablet by mouth daily., Disp: 1 Package, Rfl: 11 .  SUMAtriptan Succinate (IMITREX PO), Take by mouth., Disp: , Rfl:  .  clindamycin (CLEOCIN-T) 1 % lotion, Apply topically 2 (two) times daily. (Patient not taking: Reported on 11/11/2015), Disp: 60 mL, Rfl: 4 .  clindamycin-benzoyl peroxide (BENZACLIN) gel, Apply topically 2 (two) times daily. (Patient not taking: Reported on 11/11/2015), Disp: 50 g, Rfl: prn .  ketoconazole (NIZORAL) 2 % cream, Apply 1 application topically 2 (two) times daily. (Patient not taking: Reported on 11/11/2015), Disp: 30 g, Rfl: 2 .  methylPREDNISolone (MEDROL) 4 MG TBPK tablet, Tapering 6 day dose pack (Patient not taking: Reported on 11/11/2015), Disp: 21 tablet, Rfl: 0 .  metroNIDAZOLE (FLAGYL) 500 MG tablet, TAKE 1 TABLET (500 MG TOTAL) BY MOUTH 2 (TWO) TIMES DAILY. (Patient not taking: Reported on 11/11/2015), Disp: 14 tablet, Rfl: 0 .  VYVANSE 30 MG capsule, , Disp: , Rfl:  Allergies  Allergen Reactions  . Amoxicillin     REACTION: childhood reaction,swelling  . Latex Other (See Comments)    Irritation to mucous membranes, especially vaginal tissue  . Penicillins  REACTION: childhood reaction,swelling  . Topamax [Topiramate]     Suicidal thoughts  . Sulfa Antibiotics Swelling and Rash    Social History  Substance Use Topics  . Smoking status: Never Smoker  . Smokeless tobacco: Never Used  . Alcohol use 0.0 oz/week     Comment: socially     Family History  Problem Relation Age of Onset  . Heart disease Maternal Grandmother   . Fibroids Maternal Grandmother   . Heart disease Maternal Grandfather   . Cancer Paternal Grandmother   . Heart disease Paternal  Grandfather   . Hypertension Mother   . Fibroids Mother   . Hypertension Father   . Anesthesia problems Neg Hx   . Hypotension Neg Hx   . Malignant hyperthermia Neg Hx   . Pseudochol deficiency Neg Hx       Review of Systems  Constitutional: negative for fatigue and weight loss Respiratory: negative for cough and wheezing Cardiovascular: negative for chest pain, fatigue and palpitations Gastrointestinal: negative for abdominal pain and change in bowel habits Musculoskeletal:negative for myalgias Neurological: negative for gait problems and tremors Behavioral/Psych: negative for abusive relationship, depression Endocrine: negative for temperature intolerance   Genitourinary:negative for abnormal menstrual periods, genital lesions, hot flashes, sexual problems and vaginal discharge Integument/breast: negative for breast lump, breast tenderness, nipple discharge and skin lesion(s)    Objective:       BP (!) 143/89   Pulse 81   Temp 98.6 F (37 C)   Resp 16   Wt 247 lb (112 kg)   LMP 10/14/2015   BMI 30.07 kg/m  General:   alert  Skin:   no rash or abnormalities  Lungs:   clear to auscultation bilaterally  Heart:   regular rate and rhythm, S1, S2 normal, no murmur, click, rub or gallop  Breasts:   normal without suspicious masses, skin or nipple changes or axillary nodes  Abdomen:  normal findings: no organomegaly, soft, non-tender and no hernia  Pelvis:  External genitalia: normal general appearance Urinary system: urethral meatus normal and bladder without fullness, nontender Vaginal: normal without tenderness, induration or masses Cervix: normal appearance Adnexa: normal bimanual exam Uterus: anteverted and non-tender, normal size   Lab Review Urine pregnancy test Labs reviewed yes Radiologic studies reviewed no    Assessment:    Healthy female exam.    Plan:    Education reviewed: calcium supplements, depression evaluation, low fat, low cholesterol diet,  safe sex/STD prevention, self breast exams and weight bearing exercise. Contraception: OCP (estrogen/progesterone). Follow up in: 1 year.   Meds ordered this encounter  Medications  . VYVANSE 30 MG capsule   Orders Placed This Encounter  Procedures  . POCT Urinalysis Dipstick

## 2015-11-11 NOTE — Progress Notes (Signed)
Subjective:        Erica Wyatt is a 30 y.o. female here for a routine exam.  Current complaints: None.    Personal health questionnaire:  Is patient Ashkenazi Jewish, have a family history of breast and/or ovarian cancer: no Is there a family history of uterine cancer diagnosed at age < 91, gastrointestinal cancer, urinary tract cancer, family member who is a Personnel officer syndrome-associated carrier: no Is the patient overweight and hypertensive, family history of diabetes, personal history of gestational diabetes, preeclampsia or PCOS: no Is patient over 32, have PCOS,  family history of premature CHD under age 31, diabetes, smoke, have hypertension or peripheral artery disease:  no At any time, has a partner hit, kicked or otherwise hurt or frightened you?: no Over the past 2 weeks, have you felt down, depressed or hopeless?: no Over the past 2 weeks, have you felt little interest or pleasure in doing things?:no   Gynecologic History Patient's last menstrual period was 10/14/2015. Contraception: OCP (estrogen/progesterone) Last Pap: 2015. Results were: normal Last mammogram: n/a. Results were: n/a  Obstetric History OB History  Gravida Para Term Preterm AB Living  0 0 1  SAB TAB Ectopic Multiple Live Births  0 0 0 0 1    # Outcome Date GA Lbr Len/2nd Weight Sex Delivery Anes PTL Lv  1 Term 12/16/11 [redacted]w[redacted]d 19:20 / 01:14 8 lb 1.5 oz (3.671 kg) M Vag-Spont EPI  LIV     Birth Comments: normal newborn      Past Medical History:  Diagnosis Date  . Asthma    exercise inducted  . GERD (gastroesophageal reflux disease)   . Migraine headache   . Migraines   . Thrombocytopenia complicating pregnancy [facility])     Past Surgical History:  Procedure Laterality Date  . WISDOM TOOTH EXTRACTION       Current Outpatient Prescriptions:  .  albuterol (PROVENTIL HFA) 108 (90 BASE) MCG/ACT inhaler, Inhale 2 puffs into the lungs every 6 (six) hours as needed. Asthma attacks, Disp: 1  Inhaler, Rfl: 0 .  meloxicam (MOBIC) 15 MG tablet, Take 1 tablet (15 mg total) by mouth daily., Disp: 30 tablet, Rfl: 3 .  norgestimate-ethinyl estradiol (ORTHO-CYCLEN,SPRINTEC,PREVIFEM) 0.25-35 MG-MCG tablet, Take 1 tablet by mouth daily., Disp: 1 Package, Rfl: 11 .  SUMAtriptan Succinate (IMITREX PO), Take by mouth., Disp: , Rfl:  .  clindamycin (CLEOCIN-T) 1 % lotion, Apply topically 2 (two) times daily. (Patient not taking: Reported on 11/11/2015), Disp: 60 mL, Rfl: 4 .  clindamycin-benzoyl peroxide (BENZACLIN) gel, Apply topically 2 (two) times daily. (Patient not taking: Reported on 11/11/2015), Disp: 50 g, Rfl: prn .  fluconazole (DIFLUCAN) 150 MG tablet, , Disp: , Rfl:  .  ketoconazole (NIZORAL) 2 % cream, Apply 1 application topically 2 (two) times daily. (Patient not taking: Reported on 11/11/2015), Disp: 30 g, Rfl: 2 .  methylPREDNISolone (MEDROL) 4 MG TBPK tablet, Tapering 6 day dose pack (Patient not taking: Reported on 11/11/2015), Disp: 21 tablet, Rfl: 0 .  metroNIDAZOLE (FLAGYL) 500 MG tablet, TAKE 1 TABLET (500 MG TOTAL) BY MOUTH 2 (TWO) TIMES DAILY. (Patient not taking: Reported on 11/11/2015), Disp: 14 tablet, Rfl: 0 .  VYVANSE 30 MG capsule, , Disp: , Rfl:  Allergies  Allergen Reactions  . Amoxicillin     REACTION: childhood reaction,swelling  . Latex Other (See Comments)    Irritation to mucous membranes, especially vaginal tissue  . Penicillins     REACTION: childhood reaction,swelling  .  Topamax [Topiramate]     Suicidal thoughts  . Sulfa Antibiotics Swelling and Rash    Social History  Substance Use Topics  . Smoking status: Never Smoker  . Smokeless tobacco: Never Used  . Alcohol use 0.0 oz/week     Comment: socially     Family History  Problem Relation Age of Onset  . Heart disease Maternal Grandmother   . Fibroids Maternal Grandmother   . Heart disease Maternal Grandfather   . Cancer Paternal Grandmother   . Heart disease Paternal Grandfather   .  Hypertension Mother   . Fibroids Mother   . Hypertension Father   . Anesthesia problems Neg Hx   . Hypotension Neg Hx   . Malignant hyperthermia Neg Hx   . Pseudochol deficiency Neg Hx       Review of Systems  Constitutional: negative for fatigue and weight loss Respiratory: negative for cough and wheezing Cardiovascular: negative for chest pain, fatigue and palpitations Gastrointestinal: negative for abdominal pain and change in bowel habits Musculoskeletal:negative for myalgias Neurological: negative for gait problems and tremors Behavioral/Psych: negative for abusive relationship, depression Endocrine: negative for temperature intolerance   Genitourinary:negative for abnormal menstrual periods, genital lesions, hot flashes, sexual problems and vaginal discharge Integument/breast: negative for breast lump, breast tenderness, nipple discharge and skin lesion(s)    Objective:       BP (!) 143/89   Pulse 81   Temp 98.6 F (37 C)   Resp 16   Wt 247 lb (112 kg)   LMP 10/14/2015   BMI 30.07 kg/m  General:   alert  Skin:   no rash or abnormalities  Lungs:   clear to auscultation bilaterally  Heart:   regular rate and rhythm, S1, S2 normal, no murmur, click, rub or gallop  Breasts:   normal without suspicious masses, skin or nipple changes or axillary nodes  Abdomen:  normal findings: no organomegaly, soft, non-tender and no hernia  Pelvis:  External genitalia: normal general appearance Urinary system: urethral meatus normal and bladder without fullness, nontender Vaginal: normal without tenderness, induration or masses Cervix: normal appearance Adnexa: normal bimanual exam Uterus: anteverted and non-tender, normal size   Lab Review Urine pregnancy test Labs reviewed yes Radiologic studies reviewed no    Assessment:    Healthy female exam.    Contraceptive surveillance.  Pleased with OCP's.   Plan:    Education reviewed: calcium supplements, depression  evaluation, low fat, low cholesterol diet, safe sex/STD prevention, self breast exams and weight bearing exercise. Contraception: OCP (estrogen/progesterone). Follow up in: 1 year.   Meds ordered this encounter  Medications  . VYVANSE 30 MG capsule  . fluconazole (DIFLUCAN) 150 MG tablet   Orders Placed This Encounter  Procedures  . HIV antibody  . Hepatitis B surface antigen  . RPR  . Hepatitis C antibody  . POCT Urinalysis Dipstick  . POCT urine pregnancy

## 2015-11-12 LAB — RPR: RPR Ser Ql: NONREACTIVE

## 2015-11-12 LAB — HEPATITIS B SURFACE ANTIGEN: HEP B S AG: NEGATIVE

## 2015-11-12 LAB — HEPATITIS C ANTIBODY: HEP C VIRUS AB: 0.1 {s_co_ratio} (ref 0.0–0.9)

## 2015-11-12 LAB — HIV ANTIBODY (ROUTINE TESTING W REFLEX): HIV SCREEN 4TH GENERATION: NONREACTIVE

## 2015-11-13 LAB — PAP IG AND HPV HIGH-RISK
HPV, HIGH-RISK: NEGATIVE
PAP SMEAR COMMENT: 0

## 2015-11-14 LAB — NUSWAB VG+, CANDIDA 6SP
Atopobium vaginae: HIGH Score — AB
BVAB 2: HIGH {score} — AB
CANDIDA ALBICANS, NAA: NEGATIVE
CANDIDA TROPICALIS, NAA: NEGATIVE
Candida glabrata, NAA: NEGATIVE
Candida krusei, NAA: NEGATIVE
Candida lusitaniae, NAA: NEGATIVE
Candida parapsilosis, NAA: NEGATIVE
Chlamydia trachomatis, NAA: NEGATIVE
MEGASPHAERA 1: HIGH {score} — AB
Neisseria gonorrhoeae, NAA: NEGATIVE
TRICH VAG BY NAA: NEGATIVE

## 2015-11-15 ENCOUNTER — Other Ambulatory Visit: Payer: Self-pay | Admitting: Obstetrics

## 2015-11-15 DIAGNOSIS — B9689 Other specified bacterial agents as the cause of diseases classified elsewhere: Secondary | ICD-10-CM

## 2015-11-15 DIAGNOSIS — N760 Acute vaginitis: Principal | ICD-10-CM

## 2015-11-15 MED ORDER — METRONIDAZOLE 500 MG PO TABS
ORAL_TABLET | ORAL | 2 refills | Status: DC
Start: 2015-11-15 — End: 2016-02-11

## 2016-02-11 ENCOUNTER — Ambulatory Visit (INDEPENDENT_AMBULATORY_CARE_PROVIDER_SITE_OTHER): Payer: 59 | Admitting: Obstetrics

## 2016-02-11 ENCOUNTER — Encounter: Payer: Self-pay | Admitting: Obstetrics

## 2016-02-11 VITALS — BP 119/81 | HR 69 | Wt 242.0 lb

## 2016-02-11 DIAGNOSIS — Z30017 Encounter for initial prescription of implantable subdermal contraceptive: Secondary | ICD-10-CM

## 2016-02-11 DIAGNOSIS — Z3202 Encounter for pregnancy test, result negative: Secondary | ICD-10-CM | POA: Diagnosis not present

## 2016-02-11 DIAGNOSIS — Z01818 Encounter for other preprocedural examination: Secondary | ICD-10-CM

## 2016-02-11 DIAGNOSIS — Z01812 Encounter for preprocedural laboratory examination: Secondary | ICD-10-CM | POA: Diagnosis not present

## 2016-02-11 LAB — POCT URINE PREGNANCY: PREG TEST UR: NEGATIVE

## 2016-02-11 NOTE — Progress Notes (Signed)
Nexplanon Procedure Note   PRE-OP DIAGNOSIS: desired long-term, reversible contraception  POST-OP DIAGNOSIS: Same  PROCEDURE: Nexplanon  placement Performing Provider: Brock BadHARLES A. Omega Durante MD  Patient education prior to procedure, explained risk, benefits of Nexplanon, reviewed alternative options. Patient reported understanding. Gave consent to continue with procedure.   PROCEDURE:  Pregnancy Text :  Negative Site (check):      left arm         Sterile Preparation:   Betadinex3 Lot # [id number] Expiration Date 05 / 2019  Insertion site was selected 8 - 10 cm from medial epicondyle and marked along with guiding site using sterile marker. Procedure area was prepped and draped in a sterile fashion. 1% Lidocaine 1.5 ml given prior to procedure. Nexplanon  was inserted subcutaneously.Needle was removed from the insertion site. Nexplanon capsule was palpated by provider and patient to assure satisfactory placement. Dressing applied.  Followup: The patient tolerated the procedure well without complications.  Standard post-procedure care is explained and return precautions are given.  Brock BadHARLES A. Noel Henandez MD

## 2016-02-17 MED ORDER — ETONOGESTREL 68 MG ~~LOC~~ IMPL
68.0000 mg | DRUG_IMPLANT | Freq: Once | SUBCUTANEOUS | Status: AC
  Administered 2016-02-11: 68 mg via SUBCUTANEOUS

## 2016-02-17 NOTE — Addendum Note (Signed)
Addended by: Elby BeckPAUL, JANE F on: 02/17/2016 10:30 AM   Modules accepted: Orders

## 2016-02-25 ENCOUNTER — Encounter: Payer: Self-pay | Admitting: Obstetrics

## 2016-02-27 ENCOUNTER — Encounter: Payer: Self-pay | Admitting: Obstetrics

## 2016-02-27 ENCOUNTER — Ambulatory Visit (INDEPENDENT_AMBULATORY_CARE_PROVIDER_SITE_OTHER): Payer: 59 | Admitting: Obstetrics

## 2016-02-27 VITALS — BP 136/89 | HR 85 | Temp 98.6°F | Wt 249.8 lb

## 2016-02-27 DIAGNOSIS — B9689 Other specified bacterial agents as the cause of diseases classified elsewhere: Secondary | ICD-10-CM

## 2016-02-27 DIAGNOSIS — Z975 Presence of (intrauterine) contraceptive device: Secondary | ICD-10-CM

## 2016-02-27 DIAGNOSIS — N760 Acute vaginitis: Secondary | ICD-10-CM

## 2016-02-27 DIAGNOSIS — F419 Anxiety disorder, unspecified: Secondary | ICD-10-CM | POA: Diagnosis not present

## 2016-02-27 NOTE — Progress Notes (Signed)
Patient ID: Erica Wyatt, female   DOB: [birth year], 30 y.o.   MRN: [id number]  Chief Complaint  Patient presents with  . other    Nexplanon check    HPI Erica EpleySenaria V Chambers is a 30 y.o. female.  Presents for 2 week Nexplanon post insertion check.  No complaints except concern about chronic BV, which was recently treated with Tindamax. HPI  Past Medical History:  Diagnosis Date  . Asthma    exercise inducted  . GERD (gastroesophageal reflux disease)   . Migraine headache   . Migraines   . Thrombocytopenia complicating pregnancy [facility])     Past Surgical History:  Procedure Laterality Date  . WISDOM TOOTH EXTRACTION      Family History  Problem Relation Age of Onset  . Heart disease Maternal Grandmother   . Fibroids Maternal Grandmother   . Heart disease Maternal Grandfather   . Cancer Paternal Grandmother   . Heart disease Paternal Grandfather   . Hypertension Mother   . Fibroids Mother   . Hypertension Father   . Anesthesia problems Neg Hx   . Hypotension Neg Hx   . Malignant hyperthermia Neg Hx   . Pseudochol deficiency Neg Hx     Social History Social History  Substance Use Topics  . Smoking status: Never Smoker  . Smokeless tobacco: Never Used  . Alcohol use 0.0 oz/week     Comment: socially     Allergies  Allergen Reactions  . Amoxicillin     REACTION: childhood reaction,swelling  . Latex Other (See Comments)    Irritation to mucous membranes, especially vaginal tissue  . Penicillins     REACTION: childhood reaction,swelling  . Topamax [Topiramate]     Suicidal thoughts  . Sulfa Antibiotics Swelling and Rash    Current Outpatient Prescriptions  Medication Sig Dispense Refill  . albuterol (PROVENTIL HFA) 108 (90 BASE) MCG/ACT inhaler Inhale 2 puffs into the lungs every 6 (six) hours as needed. Asthma attacks 1 Inhaler 0  . meloxicam (MOBIC) 15 MG tablet Take 1 tablet (15 mg total) by mouth daily. (Patient not taking: Reported on 02/27/2016) 30 tablet  3   No current facility-administered medications for this visit.     Review of Systems Review of Systems Constitutional: negative for fatigue and weight loss Respiratory: negative for cough and wheezing Cardiovascular: negative for chest pain, fatigue and palpitations Gastrointestinal: negative for abdominal pain and change in bowel habits Genitourinary:positive history of recurrent BV Integument/breast: negative for nipple discharge Musculoskeletal:negative for myalgias Neurological: negative for gait problems and tremors Behavioral/Psych: negative for abusive relationship, depression Endocrine: negative for temperature intolerance      Blood pressure 136/89, pulse 85, temperature 98.6 F (37 C), temperature source Oral, weight 249 lb 12.8 oz (113.3 kg), last menstrual period 02/03/2016.  Physical Exam Physical Exam:      General:  Alert and no distress      Left Upper Extremity:  Nexplanon insertion site clean and healed.  Rod palpated intact and non tender.  50% of 15 min visit spent on counseling and coordination of care.     Data Reviewed Wet prep  Assessment     Nexplanon Surveillance.  Doing well. Recurrent BV    Plan    Continue Tindamax monthly x 2 more treatments F/U in 4 months for repeat wet prep  No orders of the defined types were placed in this encounter.  No orders of the defined types were placed in this encounter.

## 2016-05-12 ENCOUNTER — Telehealth: Payer: Self-pay | Admitting: *Deleted

## 2016-05-12 NOTE — Telephone Encounter (Signed)
Pt has a question regarding her bill. Would like a return call asap because her account is past due

## 2016-05-12 NOTE — Telephone Encounter (Signed)
Pt paid her bal with us - she will call [facility] about bad debt balances

## 2016-11-11 ENCOUNTER — Ambulatory Visit (INDEPENDENT_AMBULATORY_CARE_PROVIDER_SITE_OTHER): Payer: 59 | Admitting: Obstetrics

## 2016-11-11 ENCOUNTER — Encounter: Payer: Self-pay | Admitting: Obstetrics

## 2016-11-11 VITALS — BP 117/75 | HR 65 | Wt 243.0 lb

## 2016-11-11 DIAGNOSIS — Z01419 Encounter for gynecological examination (general) (routine) without abnormal findings: Secondary | ICD-10-CM

## 2016-11-11 DIAGNOSIS — Z113 Encounter for screening for infections with a predominantly sexual mode of transmission: Secondary | ICD-10-CM

## 2016-11-11 DIAGNOSIS — Z3046 Encounter for surveillance of implantable subdermal contraceptive: Secondary | ICD-10-CM

## 2016-11-11 DIAGNOSIS — Z124 Encounter for screening for malignant neoplasm of cervix: Secondary | ICD-10-CM | POA: Diagnosis not present

## 2016-11-11 NOTE — Progress Notes (Signed)
Subjective:        Erica Wyatt is a 31 y.o. female here for a routine exam.  Current complaints: None.    Personal health questionnaire:  Is patient Ashkenazi Jewish, have a family history of breast and/or ovarian cancer: no Is there a family history of uterine cancer diagnosed at age < 1850, gastrointestinal cancer, urinary tract cancer, family member who is a Personnel officerLynch syndrome-associated carrier: no Is the patient overweight and hypertensive, family history of diabetes, personal history of gestational diabetes, preeclampsia or PCOS: no Is patient over 2955, have PCOS,  family history of premature CHD under age 32, diabetes, smoke, have hypertension or peripheral artery disease:  no At any time, has a partner hit, kicked or otherwise hurt or frightened you?: no Over the past 2 weeks, have you felt down, depressed or hopeless?: no Over the past 2 weeks, have you felt little interest or pleasure in doing things?:no   Gynecologic History Patient's last menstrual period was 10/03/2016 (exact date). Contraception: Nexplanon Last Pap: 2017. Results were: normal Last mammogram: n/a. Results were: n/a  Obstetric History OB History  Gravida Para Term Preterm AB Living  1 1 1  0 0 1  SAB TAB Ectopic Multiple Live Births  0 0 0 0 1    # Outcome Date GA Lbr Len/2nd Weight Sex Delivery Anes PTL Lv  1 Term 12/16/11 [id number] 19:20 / 01:14 8 lb 1.5 oz (3.671 kg) M Vag-Spont EPI  LIV     Birth Comments: normal newborn      Past Medical History:  Diagnosis Date  . Asthma    exercise inducted  . GERD (gastroesophageal reflux disease)   . Migraine headache   . Migraines   . Thrombocytopenia complicating pregnancy [facility](HCC)     Past Surgical History:  Procedure Laterality Date  . WISDOM TOOTH EXTRACTION       Current Outpatient Prescriptions:  .  etonogestrel (NEXPLANON) 68 MG IMPL implant, 1 each by Subdermal route once., Disp: , Rfl:  .  methylphenidate 36 MG PO CR tablet, Take 36 mg by  mouth daily., Disp: , Rfl:  .  albuterol (PROVENTIL HFA) 108 (90 BASE) MCG/ACT inhaler, Inhale 2 puffs into the lungs every 6 (six) hours as needed. Asthma attacks, Disp: 1 Inhaler, Rfl: 0 .  meloxicam (MOBIC) 15 MG tablet, Take 1 tablet (15 mg total) by mouth daily. (Patient not taking: Reported on 02/27/2016), Disp: 30 tablet, Rfl: 3 Allergies  Allergen Reactions  . Amoxicillin     REACTION: childhood reaction,swelling  . Latex Other (See Comments)    Irritation to mucous membranes, especially vaginal tissue  . Penicillins     REACTION: childhood reaction,swelling  . Topamax [Topiramate]     Suicidal thoughts  . Sulfa Antibiotics Swelling and Rash    Social History  Substance Use Topics  . Smoking status: Never Smoker  . Smokeless tobacco: Never Used  . Alcohol use 0.0 oz/week     Comment: socially     Family History  Problem Relation Age of Onset  . Heart disease Maternal Grandmother   . Fibroids Maternal Grandmother   . Heart disease Maternal Grandfather   . Cancer Paternal Grandmother   . Heart disease Paternal Grandfather   . Hypertension Mother   . Fibroids Mother   . Multiple sclerosis Mother   . Hypertension Father   . Anesthesia problems Neg Hx   . Hypotension Neg Hx   . Malignant hyperthermia Neg Hx   . Pseudochol  deficiency Neg Hx       Review of Systems  Constitutional: negative for fatigue and weight loss Respiratory: negative for cough and wheezing Cardiovascular: negative for chest pain, fatigue and palpitations Gastrointestinal: negative for abdominal pain and change in bowel habits Musculoskeletal:negative for myalgias Neurological: negative for gait problems and tremors Behavioral/Psych: negative for abusive relationship, depression Endocrine: negative for temperature intolerance    Genitourinary:negative for abnormal menstrual periods, genital lesions, hot flashes, sexual problems and vaginal discharge Integument/breast: negative for breast lump,  breast tenderness, nipple discharge and skin lesion(s)    Objective:       BP 117/75   Pulse 65   Wt 243 lb (110.2 kg)   LMP 10/03/2016 (Exact Date)   BMI 29.58 kg/m  General:   alert  Skin:   no rash or abnormalities  Lungs:   clear to auscultation bilaterally  Heart:   regular rate and rhythm, S1, S2 normal, no murmur, click, rub or gallop  Breasts:   normal without suspicious masses, skin or nipple changes or axillary nodes  Abdomen:  normal findings: no organomegaly, soft, non-tender and no hernia  Pelvis:  External genitalia: normal general appearance Urinary system: urethral meatus normal and bladder without fullness, nontender Vaginal: normal without tenderness, induration or masses Cervix: normal appearance Adnexa: normal bimanual exam Uterus: anteverted and non-tender, normal size   Lab Review Urine pregnancy test Labs reviewed yes Radiologic studies reviewed no  50% of 20 min visit spent on counseling and coordination of care.    Assessment:     1. Encounter for routine gynecological examination with Papanicolaou smear of cervix Rx: - Cytology - PAP - Cervicovaginal ancillary only  2. Encounter for surveillance of implantable subdermal contraceptive - doing well with Nexplanon   Plan:    Education reviewed: calcium supplements, depression evaluation, low fat, low cholesterol diet, safe sex/STD prevention, self breast exams and weight bearing exercise. Contraception: Nexplanon. Follow up in: 1 year.   Meds ordered this encounter  Medications  . methylphenidate 36 MG PO CR tablet    Sig: Take 36 mg by mouth daily.  Marland Kitchen. etonogestrel (NEXPLANON) 68 MG IMPL implant    Sig: 1 each by Subdermal route once.   No orders of the defined types were placed in this encounter.    Patient ID: Erica Wyatt, female   DOB: Jan 31, 1986, 31 y.o.   MRN: [id number]

## 2016-11-11 NOTE — Progress Notes (Signed)
Patient is in the office for annual exam. Patient does report she has random cramping at times. She did have one prolonged cycle- she has Nexplanon. She also wants to discuss fibroids and family history.

## 2016-11-13 LAB — CERVICOVAGINAL ANCILLARY ONLY
Bacterial vaginitis: POSITIVE — AB
Candida vaginitis: POSITIVE — AB
Chlamydia: NEGATIVE
Neisseria Gonorrhea: NEGATIVE
Trichomonas: NEGATIVE

## 2016-11-13 LAB — CYTOLOGY - PAP
Diagnosis: NEGATIVE
HPV: NOT DETECTED

## 2016-11-14 ENCOUNTER — Other Ambulatory Visit: Payer: Self-pay | Admitting: Obstetrics

## 2016-11-14 DIAGNOSIS — B3731 Acute candidiasis of vulva and vagina: Secondary | ICD-10-CM

## 2016-11-14 DIAGNOSIS — N760 Acute vaginitis: Principal | ICD-10-CM

## 2016-11-14 DIAGNOSIS — B373 Candidiasis of vulva and vagina: Secondary | ICD-10-CM

## 2016-11-14 DIAGNOSIS — B9689 Other specified bacterial agents as the cause of diseases classified elsewhere: Secondary | ICD-10-CM

## 2016-11-14 MED ORDER — FLUCONAZOLE 150 MG PO TABS: 150.0000 mg | ORAL_TABLET | Freq: Once | ORAL | 2 refills | Status: AC

## 2016-11-14 MED ORDER — METRONIDAZOLE 500 MG PO TABS: 500.0000 mg | ORAL_TABLET | Freq: Two times a day (BID) | ORAL | 2 refills | Status: DC

## 2017-02-23 ENCOUNTER — Other Ambulatory Visit: Payer: Self-pay

## 2017-02-23 DIAGNOSIS — N760 Acute vaginitis: Secondary | ICD-10-CM

## 2017-02-23 DIAGNOSIS — B3731 Acute candidiasis of vulva and vagina: Secondary | ICD-10-CM

## 2017-02-23 DIAGNOSIS — B373 Candidiasis of vulva and vagina: Secondary | ICD-10-CM

## 2017-02-23 DIAGNOSIS — B9689 Other specified bacterial agents as the cause of diseases classified elsewhere: Secondary | ICD-10-CM

## 2017-02-24 MED ORDER — FLUCONAZOLE 150 MG PO TABS: 150.0000 mg | ORAL_TABLET | Freq: Once | ORAL | 0 refills | Status: AC

## 2017-02-24 MED ORDER — SECNIDAZOLE 2 G PO PACK: 1.0000 | PACK | Freq: Once | ORAL | 0 refills | Status: AC

## 2017-02-24 NOTE — Telephone Encounter (Signed)
Returned call and pt stated that she is having vaginal irritation with watery discharge and would like a refill on rx for BV and diflucan for yeast. Pt states that she has trouble completing 7 day flagyl b/c it upsets her stomach and would like to try solosec with a discount card, advised I would route to provider for review.

## 2017-08-10 ENCOUNTER — Other Ambulatory Visit: Payer: Self-pay

## 2017-08-10 DIAGNOSIS — N898 Other specified noninflammatory disorders of vagina: Secondary | ICD-10-CM

## 2017-08-10 MED ORDER — FLUCONAZOLE 150 MG PO TABS: 150.0000 mg | ORAL_TABLET | Freq: Once | ORAL | 0 refills | Status: AC

## 2017-08-10 MED ORDER — METRONIDAZOLE 500 MG PO TABS: 500.0000 mg | ORAL_TABLET | Freq: Two times a day (BID) | ORAL | 0 refills | Status: DC

## 2017-08-10 NOTE — Progress Notes (Signed)
BV and yeast sent per office protocol. To pt pharmacy  Pt made aware if no relief she needs to make an appt.

## 2017-09-23 ENCOUNTER — Ambulatory Visit: Payer: 59

## 2017-09-23 ENCOUNTER — Encounter: Payer: Self-pay | Admitting: Podiatry

## 2017-09-23 ENCOUNTER — Telehealth: Payer: Self-pay | Admitting: *Deleted

## 2017-09-23 ENCOUNTER — Ambulatory Visit (INDEPENDENT_AMBULATORY_CARE_PROVIDER_SITE_OTHER): Payer: 59 | Admitting: Podiatry

## 2017-09-23 DIAGNOSIS — M76822 Posterior tibial tendinitis, left leg: Secondary | ICD-10-CM

## 2017-09-23 DIAGNOSIS — M799 Soft tissue disorder, unspecified: Secondary | ICD-10-CM | POA: Diagnosis not present

## 2017-09-23 DIAGNOSIS — M66872 Spontaneous rupture of other tendons, left ankle and foot: Secondary | ICD-10-CM

## 2017-09-23 NOTE — Progress Notes (Signed)
[facility] today [facility]  She denies fever chills nausea vomiting muscle aches and pains other than the ankle left.  She states that she has been given steroids when her Achilles was hurting and that made that feel much better however then she started to develop pain along the medial aspect of the left heel.  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  She [facility].  Objective: Vital signs are stable alert and oriented x3.  Pulses are palpable.  Neurologic sensorium is intact.  Deep tendon reflexes are intact.  Muscle strength was 5/5 dorsiflexors plantar flexors inverters everters onto the musculature is intact with exception of her posterior tibial tendon left.  There is consider mild fluctuance beneath the medial malleolus and tenderness on palpation of this area.  It extends from the posterior superior aspect of the left ankle to the navicular tuberosity.  Radiographs and MRI provided today on a disc demonstrate what appears to be an interruption of the posterior tibial tendon.  I will send this out for over read.  Assessment: Tear of the posterior tibial tendon left.  Plan: She was consented today for a primary repair of the posterior tibial tendon with possible Kidner left ankle.  She will also have a cast.  We discussed this plan in great detail today she understands that and is amenable to it.  We discussed the possible postop complications which may include but not limited to postop pain bleeding swelling infection recurrence need further surgery overcorrection under correction.  She knows that she will be out for at least 8 weeks possibly as long as 12 more than likely she will need some rehabilitation.

## 2017-09-23 NOTE — Telephone Encounter (Signed)
Mailed copy of [facility] disc [facility], to [facility].

## 2017-09-23 NOTE — Patient Instructions (Signed)
Pre-Operative Instructions  Congratulations, you have decided to take an important step towards improving your quality of life.  You can be assured that the doctors and staff at [facility] will be with you every step of the way.  Here are some important things you should know:  1. Plan to be at the surgery center/hospital at least 1 (one) hour prior to your scheduled time, unless otherwise directed by the surgical center/hospital staff.  You must have a responsible adult accompany you, remain during the surgery and drive you home.  Make sure you have directions to the surgical center/hospital to ensure you arrive on time. 2. If you are having surgery at [facility] or [facility], you will need a copy of your medical history and physical form from your family physician within one month prior to the date of surgery. We will give you a form for your primary physician to complete.  3. We make every effort to accommodate the date you request for surgery.  However, there are times where surgery dates or times have to be moved.  We will contact you as soon as possible if a change in schedule is required.   4. No aspirin/ibuprofen for one week before surgery.  If you are on aspirin, any non-steroidal anti-inflammatory medications (Mobic, Aleve, Ibuprofen) should not be taken seven (7) days prior to your surgery.  You make take Tylenol for pain prior to surgery.  5. Medications - If you are taking daily heart and blood pressure medications, seizure, reflux, allergy, asthma, anxiety, pain or diabetes medications, make sure you notify the surgery center/hospital before the day of surgery so they can tell you which medications you should take or avoid the day of surgery. 6. No food or drink after midnight the night before surgery unless directed otherwise by surgical center/hospital staff. 7. No alcoholic beverages 24-hours prior to surgery.  No smoking 24-hours prior or 24-hours after  surgery. 8. Wear loose pants or shorts. They should be loose enough to fit over bandages, boots, and casts. 9. Don't wear slip-on shoes. Sneakers are preferred. 10. Bring your boot with you to the surgery center/hospital.  Also bring crutches or a walker if your physician has prescribed it for you.  If you do not have this equipment, it will be provided for you after surgery. 11. If you have not been contacted by the surgery center/hospital by the day before your surgery, call to confirm the date and time of your surgery. 12. Leave-time from work may vary depending on the type of surgery you have.  Appropriate arrangements should be made prior to surgery with your employer. 13. Prescriptions will be provided immediately following surgery by your doctor.  Fill these as soon as possible after surgery and take the medication as directed. Pain medications will not be refilled on weekends and must be approved by the doctor. 14. Remove nail polish on the operative foot and avoid getting pedicures prior to surgery. 15. Wash the night before surgery.  The night before surgery wash the foot and leg well with water and the antibacterial soap provided. Be sure to pay special attention to beneath the toenails and in between the toes.  Wash for at least three (3) minutes. Rinse thoroughly with water and dry well with a towel.  Perform this wash unless told not to do so by your physician.  Enclosed: 1 Ice pack (please put in freezer the night before surgery)   1 Hibiclens skin cleaner     Pre-op instructions  If you have any questions regarding the instructions, please do not hesitate to call our office.  [place]: [address], [place], [place] [address] -- [phone]  [place]: [address] [place], [place] [address] -- [phone]  [place]: [address]  [place], [place] [address] -- [phone]  [place]: [address], [address], [place], [place] [address] -- [phone]  Website:  [email or web address] 

## 2017-09-24 ENCOUNTER — Encounter: Payer: Self-pay | Admitting: Podiatry

## 2017-09-29 ENCOUNTER — Telehealth: Payer: Self-pay | Admitting: *Deleted

## 2017-09-29 NOTE — Telephone Encounter (Signed)
Pt called for overread results. Left message informing pt I had faxed a request for the overread results and would call tomorrow.

## 2017-09-30 ENCOUNTER — Encounter: Payer: Self-pay | Admitting: Podiatry

## 2017-09-30 ENCOUNTER — Ambulatory Visit (INDEPENDENT_AMBULATORY_CARE_PROVIDER_SITE_OTHER): Payer: 59 | Admitting: Podiatry

## 2017-09-30 DIAGNOSIS — M66872 Spontaneous rupture of other tendons, left ankle and foot: Secondary | ICD-10-CM

## 2017-09-30 NOTE — Progress Notes (Signed)
She presents today once again to discuss her posterior tibial tendon repair MRI results come back today positive for a 7.1 cm aggregate tear.  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

## 2017-10-01 ENCOUNTER — Encounter: Payer: Self-pay | Admitting: Podiatry

## 2017-10-06 ENCOUNTER — Telehealth: Payer: Self-pay | Admitting: *Deleted

## 2017-10-06 NOTE — Telephone Encounter (Signed)
Left message informing pt the MRI disc had been returned from the Radiology specialist, she could pick it up [facility] the [facility].

## 2017-10-07 ENCOUNTER — Other Ambulatory Visit: Payer: Self-pay | Admitting: Podiatry

## 2017-10-07 MED ORDER — ONDANSETRON HCL 4 MG PO TABS: 4.0000 mg | ORAL_TABLET | Freq: Three times a day (TID) | ORAL | 0 refills | Status: DC | PRN

## 2017-10-07 MED ORDER — OXYCODONE-ACETAMINOPHEN 10-325 MG PO TABS: 1.0000 | ORAL_TABLET | Freq: Four times a day (QID) | ORAL | 0 refills | Status: AC | PRN

## 2017-10-07 MED ORDER — CLINDAMYCIN HCL 150 MG PO CAPS: 150.0000 mg | ORAL_CAPSULE | Freq: Three times a day (TID) | ORAL | 0 refills | Status: DC

## 2017-10-08 DIAGNOSIS — M66872 Spontaneous rupture of other tendons, left ankle and foot: Secondary | ICD-10-CM | POA: Diagnosis not present

## 2017-10-08 HISTORY — PX: FOOT SPLIT TRANSFER OF THE POSTERIOR TIBIALIS TENDON PROCEDURE: SHX1669

## 2017-10-12 ENCOUNTER — Encounter: Payer: 59 | Admitting: Podiatry

## 2017-10-13 ENCOUNTER — Ambulatory Visit (INDEPENDENT_AMBULATORY_CARE_PROVIDER_SITE_OTHER): Payer: 59 | Admitting: Podiatry

## 2017-10-13 ENCOUNTER — Encounter: Payer: Self-pay | Admitting: Podiatry

## 2017-10-13 VITALS — BP 128/85 | HR 98 | Resp 16

## 2017-10-13 DIAGNOSIS — M66872 Spontaneous rupture of other tendons, left ankle and foot: Secondary | ICD-10-CM

## 2017-10-13 DIAGNOSIS — M799 Soft tissue disorder, unspecified: Secondary | ICD-10-CM

## 2017-10-13 MED ORDER — HYDROCODONE-ACETAMINOPHEN 10-325 MG PO TABS: 1.0000 | ORAL_TABLET | Freq: Four times a day (QID) | ORAL | 0 refills | Status: DC | PRN

## 2017-10-13 NOTE — Progress Notes (Signed)
She presents today with her mother for her first postop visit status post endoscopic plantar fasciotomy with peroneal tendon repair and cast.  Date of surgery 10/08/2017.  Objective: Vital signs are stable she is alert and oriented x3.  No calf pain toes have good full range of motion no signs of infection.  Radiographs were not performed today.  Assessment: Well-healing surgical foot leg left.  Plan: Follow-up with her in 1 week for cast removal and reapplication.

## 2017-10-21 ENCOUNTER — Ambulatory Visit (INDEPENDENT_AMBULATORY_CARE_PROVIDER_SITE_OTHER): Payer: Self-pay | Admitting: Podiatry

## 2017-10-21 ENCOUNTER — Encounter: Payer: Self-pay | Admitting: Podiatry

## 2017-10-21 DIAGNOSIS — M799 Soft tissue disorder, unspecified: Secondary | ICD-10-CM

## 2017-10-21 DIAGNOSIS — M76822 Posterior tibial tendinitis, left leg: Secondary | ICD-10-CM

## 2017-10-25 NOTE — Progress Notes (Signed)
Subjective: Erica Wyatt is a 32 y.o. is seen today in office s/p left posterior tibial tendon repair, endoscopic plantar fascial release preformed by Dr. Al CorpusHyatt.  She presents today for cast change.  She states that she is doing well her pain is controlled.  She is remained nonweightbearing.  She does state that she has gone a couple times with no injury during the time of the fall.  Denies any systemic complaints such as fevers, chills, nausea, vomiting. No calf pain, chest pain, shortness of breath.   Objective: General: No acute distress, AAOx3  DP/PT pulses palpable 2/4, CRT < 3 sec to all digits.  Protective sensation intact. Motor function intact.  LEFT foot: Incision is well coapted without any evidence of dehiscence and staples are intact as well as sutures. There is no surrounding erythema, ascending cellulitis, fluctuance, crepitus, malodor, drainage/purulence. There is minimal edema around the surgical site. There is minimal pain along the surgical site.  No signs of infection she is doing well. No other areas of tenderness to bilateral lower extremities.  No other open lesions or pre-ulcerative lesions.  No pain with calf compression, swelling, warmth, erythema.   Assessment and Plan:  Status post left ankle surgery, doing well with no complications   -Treatment options discussed including all alternatives, risks, and complications -The cast was removed today and it appears that the incisions are healing well.  Antibiotic ointment and a bandage was applied followed by well-padded below-knee fiberglass cast. -Continue nonweightbearing -Ice/elevation -Pain medication as needed. -Monitor for any clinical signs or symptoms of infection and DVT/PE and directed to call the office immediately should any occur or go to the ER. -Follow-up as scheduled with Dr. Al CorpusHyatt or sooner if any problems arise. In the meantime, encouraged to call the office with any questions, concerns, change in  symptoms.   Ovid CurdMatthew Coralee Edberg, DPM

## 2017-11-02 ENCOUNTER — Ambulatory Visit (INDEPENDENT_AMBULATORY_CARE_PROVIDER_SITE_OTHER): Payer: 59 | Admitting: Podiatry

## 2017-11-02 DIAGNOSIS — M76822 Posterior tibial tendinitis, left leg: Secondary | ICD-10-CM | POA: Diagnosis not present

## 2017-11-03 NOTE — Progress Notes (Signed)
She presents today with her mother for follow-up of her posterior tibial tendon repair left.  She states that she has fallen recently but not damaging the foot and she did not hurt herself.  Objective: Left cast was intact and appeared to be relatively clean once removed demonstrates no erythema edema cellulitis drainage or odor staples are intact margins appear to be well coapted she is good range of motion of the ankle joint.  Assessment: Well-healing surgical foot.  Plan: We will place her in a cam walker still to be nonweightbearing at this point I will allow her to take a shower or bath but not put weight on the foot without the boot on.  I prefer her not to put any weight on the foot for at least the next couple of weeks she understands that runs is amenable to it.  I will follow-up with her in a couple weeks at which time we will plan to start partial weightbearing and consider some physical therapy.

## 2017-11-09 ENCOUNTER — Encounter: Payer: Self-pay | Admitting: *Deleted

## 2017-11-09 ENCOUNTER — Telehealth: Payer: Self-pay | Admitting: Podiatry

## 2017-11-09 NOTE — Telephone Encounter (Signed)
I spoke with pt she states she has surgery 10/08/2017 and received the knee scooter 10/11/2017. I told pt I would have the note ready for her today. Pt states her mtr will pick it up.

## 2017-11-09 NOTE — Telephone Encounter (Signed)
Patient needs a letter for her insurance company for the knee scooter. She has been paying out of pocket for it and needs it for about 4-6 weeks.

## 2017-11-11 ENCOUNTER — Encounter

## 2017-11-18 ENCOUNTER — Encounter: Payer: Self-pay | Admitting: Podiatry

## 2017-11-18 ENCOUNTER — Ambulatory Visit (INDEPENDENT_AMBULATORY_CARE_PROVIDER_SITE_OTHER): Payer: 59 | Admitting: Podiatry

## 2017-11-18 DIAGNOSIS — Z9889 Other specified postprocedural states: Secondary | ICD-10-CM

## 2017-11-18 DIAGNOSIS — M66872 Spontaneous rupture of other tendons, left ankle and foot: Secondary | ICD-10-CM

## 2017-11-18 NOTE — Progress Notes (Signed)
She presents today for follow-up of posterior tibial tendon repair left.  She states that she is doing quite well not much pain.  Still little stiff but not terribly.  Continues to wear her cam walker and use her knee scooter.  States that she has been walking around the house upstairs without the use of the knee scooter and admits to walking without the boot at times.  Objective: Vital signs are stable she is alert and oriented x3.  Pulses are palpable.  There is no erythema to some mild edema no cellulitis drainage or odor incision sites: Healed very nicely and looks very good she has good inversion against resistance.  She has some tenderness along the medial longitudinal arch and the arch height is higher than it was prior to surgery.  Assessment: Well-healing surgical foot.  Plan: I feel is necessary at this point in order to try to get her back into her work schedule we need to send her for physical therapy this should take a month or better to recondition her.  I will follow-up with her at that time.

## 2017-11-19 ENCOUNTER — Telehealth: Payer: Self-pay | Admitting: *Deleted

## 2017-11-19 DIAGNOSIS — Z9889 Other specified postprocedural states: Secondary | ICD-10-CM

## 2017-11-19 NOTE — Telephone Encounter (Signed)
[facility] and [facility]

## 2017-11-19 NOTE — Telephone Encounter (Signed)
-----   Message from Kristian CoveyAshley E Prevette, [facility] sent at 11/18/2017  3:21 PM EDT ----- Regarding: [facility] - in house  S/p posterior tibial tendon repair left   Duration: 3 x week x 4 weeks

## 2017-12-06 ENCOUNTER — Telehealth: Payer: Self-pay | Admitting: Podiatry

## 2017-12-06 NOTE — Telephone Encounter (Signed)
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 Please give her a call.  Fax Letter to:   [employer] [facility] Fax Number: [phone] Attention: Yehuda BuddKaren Wilkinson

## 2017-12-07 ENCOUNTER — Encounter: Payer: Self-pay | Admitting: *Deleted

## 2017-12-07 NOTE — Telephone Encounter (Signed)
Faxed note to [employer] - Yehuda BuddKaren Wilkinson.

## 2017-12-07 NOTE — Telephone Encounter (Signed)
ok 

## 2017-12-07 NOTE — Telephone Encounter (Signed)
I spoke to pt and she states she would like to return to work 12/09/2017.

## 2017-12-16 ENCOUNTER — Ambulatory Visit (INDEPENDENT_AMBULATORY_CARE_PROVIDER_SITE_OTHER): Payer: 59 | Admitting: Podiatry

## 2017-12-16 ENCOUNTER — Encounter: Payer: Self-pay | Admitting: Podiatry

## 2017-12-16 DIAGNOSIS — M66872 Spontaneous rupture of other tendons, left ankle and foot: Secondary | ICD-10-CM | POA: Diagnosis not present

## 2017-12-16 DIAGNOSIS — Z9889 Other specified postprocedural states: Secondary | ICD-10-CM

## 2017-12-18 NOTE — Progress Notes (Signed)
She presents today for weeks postop visit posterior tibial tendon repair left foot.  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  Denies fever chills nausea vomiting muscle aches pains chest pain calf pain shortness of breath.  Objective: Vitals are stable she is alert and oriented x3 foot is healing very nicely scar is repairing itself quite nicely.  Has good inversion against resistance.  Foot appears to be rectus in nature very happy with outcome thus far.  Assessment: Well-healing surgical foot.  Plan: We will place her in a Tri-Lock brace and she will continue desk duty until at least November 1 and she will continue physical therapy and strength training.

## 2018-01-13 ENCOUNTER — Ambulatory Visit (INDEPENDENT_AMBULATORY_CARE_PROVIDER_SITE_OTHER): Payer: 59 | Admitting: Podiatry

## 2018-01-13 ENCOUNTER — Encounter: Payer: Self-pay | Admitting: Podiatry

## 2018-01-13 DIAGNOSIS — M66872 Spontaneous rupture of other tendons, left ankle and foot: Secondary | ICD-10-CM | POA: Diagnosis not present

## 2018-01-13 DIAGNOSIS — Z9889 Other specified postprocedural states: Secondary | ICD-10-CM

## 2018-01-13 MED ORDER — MELOXICAM 15 MG PO TABS: 15.0000 mg | ORAL_TABLET | Freq: Every day | ORAL | 3 refills | Status: DC

## 2018-01-13 NOTE — Progress Notes (Signed)
She presents today with 4 days of physical therapy remaining date of surgery 10/08/2017 status post Kidner procedure with advancement of the posterior tibial tendon left.  She states there is still swelling some and feeling a little tender today I feels like I got may be too much of a workout physical therapy but all in all is better all the time.  Objective: Vital signs stable alert and oriented x3.  Pulses are palpable.  Neurologic sensorium is intact.  She has tenderness on palpation of just a certain area just beneath the medial malleolus left foot.  Otherwise she has good inversion against resistance.  Assessment: Well-healing surgical foot with mild tendinitis to the medial aspect of the ankle.  Plan: At this point she will go back to physical therapy and told her to make sure that she ices after physical therapy and I will follow-up with her in the near future.  Also wrote a prescription for meloxicam 15 mg 1 p.o. daily

## 2018-01-19 ENCOUNTER — Other Ambulatory Visit: Payer: Self-pay

## 2018-01-19 DIAGNOSIS — B9689 Other specified bacterial agents as the cause of diseases classified elsewhere: Secondary | ICD-10-CM

## 2018-01-19 DIAGNOSIS — N760 Acute vaginitis: Principal | ICD-10-CM

## 2018-01-19 MED ORDER — METRONIDAZOLE 0.75 % VA GEL: 1.0000 | Freq: Every day | VAGINAL | 1 refills | Status: DC

## 2018-01-19 NOTE — Progress Notes (Signed)
Pt called stating she is having symptoms of vaginal discharge and a fishy odor. I advised pt I would send in Metrogel for BV per protocol. Pt verbalized understanding.

## 2018-01-31 ENCOUNTER — Ambulatory Visit (INDEPENDENT_AMBULATORY_CARE_PROVIDER_SITE_OTHER): Payer: 59 | Admitting: Obstetrics

## 2018-01-31 ENCOUNTER — Other Ambulatory Visit: Payer: Self-pay

## 2018-01-31 ENCOUNTER — Encounter: Payer: Self-pay | Admitting: Obstetrics

## 2018-01-31 VITALS — BP 127/81 | HR 70 | Ht 76.0 in | Wt 253.7 lb

## 2018-01-31 DIAGNOSIS — N760 Acute vaginitis: Secondary | ICD-10-CM | POA: Diagnosis not present

## 2018-01-31 DIAGNOSIS — Z3046 Encounter for surveillance of implantable subdermal contraceptive: Secondary | ICD-10-CM

## 2018-01-31 DIAGNOSIS — Z113 Encounter for screening for infections with a predominantly sexual mode of transmission: Secondary | ICD-10-CM

## 2018-01-31 DIAGNOSIS — F988 Other specified behavioral and emotional disorders with onset usually occurring in childhood and adolescence: Secondary | ICD-10-CM

## 2018-01-31 DIAGNOSIS — Z124 Encounter for screening for malignant neoplasm of cervix: Secondary | ICD-10-CM

## 2018-01-31 DIAGNOSIS — Z1151 Encounter for screening for human papillomavirus (HPV): Secondary | ICD-10-CM | POA: Diagnosis not present

## 2018-01-31 DIAGNOSIS — N898 Other specified noninflammatory disorders of vagina: Secondary | ICD-10-CM

## 2018-01-31 DIAGNOSIS — N946 Dysmenorrhea, unspecified: Secondary | ICD-10-CM

## 2018-01-31 DIAGNOSIS — B9689 Other specified bacterial agents as the cause of diseases classified elsewhere: Secondary | ICD-10-CM | POA: Diagnosis not present

## 2018-01-31 DIAGNOSIS — Z01419 Encounter for gynecological examination (general) (routine) without abnormal findings: Secondary | ICD-10-CM | POA: Diagnosis not present

## 2018-01-31 MED ORDER — METRONIDAZOLE 0.75 % VA GEL: 1.0000 | Freq: Every day | VAGINAL | 2 refills | Status: DC

## 2018-01-31 MED ORDER — IBUPROFEN 800 MG PO TABS: 800.0000 mg | ORAL_TABLET | Freq: Three times a day (TID) | ORAL | 5 refills | Status: DC | PRN

## 2018-01-31 NOTE — Progress Notes (Signed)
Presents for AEX/PAP, wants STD check.  Declined FLU vaccine.

## 2018-01-31 NOTE — Progress Notes (Signed)
Subjective:        Erica Wyatt is a 32 y.o. female here for a routine exam.  Current complaints: None.    Personal health questionnaire:  Is patient Erica Wyatt, have a family history of breast and/or ovarian cancer: no Is there a family history of uterine cancer diagnosed at age < 34, gastrointestinal cancer, urinary tract cancer, family member who is a Personnel officer syndrome-associated carrier: no Is the patient overweight and hypertensive, family history of diabetes, personal history of gestational diabetes, preeclampsia or PCOS: no Is patient over 60, have PCOS,  family history of premature CHD under age 85, diabetes, smoke, have hypertension or peripheral artery disease:  no At any time, has a partner hit, kicked or otherwise hurt or frightened you?: no Over the past 2 weeks, have you felt down, depressed or hopeless?: no Over the past 2 weeks, have you felt little interest or pleasure in doing things?:no   Gynecologic History Patient's last menstrual period was 01/25/2018. Contraception: Nexplanon Last Pap: 2018. Results were: normal Last mammogram: n/a. Results were: n/a  Obstetric History OB History  Gravida Para Term Preterm AB Living  1 1 1  0 0 1  SAB TAB Ectopic Multiple Live Births  0 0 0 0 1    # Outcome Date GA Lbr Len/2nd Weight Sex Delivery Anes PTL Lv  1 Term 12/16/11 [redacted]w[redacted]d 19:20 / 01:14 8 lb 1.5 oz (3.671 kg) M Vag-Spont EPI  LIV     Birth Comments: normal newborn    Past Medical History:  Diagnosis Date  . Asthma    exercise inducted  . GERD (gastroesophageal reflux disease)   . Migraine headache   . Migraines   . Thrombocytopenia complicating pregnancy [facility])     Past Surgical History:  Procedure Laterality Date  . WISDOM TOOTH EXTRACTION       Current Outpatient Medications:  .  albuterol (PROVENTIL HFA) 108 (90 BASE) MCG/ACT inhaler, Inhale 2 puffs into the lungs every 6 (six) hours as needed. Asthma attacks, Disp: 1 Inhaler, Rfl: 0 .   etonogestrel (NEXPLANON) 68 MG IMPL implant, 1 each by Subdermal route once., Disp: , Rfl:  .  meloxicam (MOBIC) 15 MG tablet, Take 1 tablet (15 mg total) by mouth daily., Disp: 30 tablet, Rfl: 3 .  methylphenidate 36 MG PO CR tablet, Take 36 mg by mouth daily., Disp: , Rfl:  .  metroNIDAZOLE (METROGEL) 0.75 % vaginal gel, Place 1 Applicatorful vaginally at bedtime. Apply one applicatorful to vagina at bedtime for 5 days, Disp: 70 g, Rfl: 1 Allergies  Allergen Reactions  . Amoxicillin     REACTION: childhood reaction,swelling  . Latex Other (See Comments)    Irritation to mucous membranes, especially vaginal tissue  . Penicillins     REACTION: childhood reaction,swelling  . Topamax [Topiramate]     Suicidal thoughts  . Sulfa Antibiotics Swelling and Rash    Social History   Tobacco Use  . Smoking status: Never Smoker  . Smokeless tobacco: Never Used  Substance Use Topics  . Alcohol use: Yes    Alcohol/week: 0.0 standard drinks    Comment: socially     Family History  Problem Relation Age of Onset  . Heart disease Maternal Grandmother   . Fibroids Maternal Grandmother   . Heart disease Maternal Grandfather   . Cancer Paternal Grandmother   . Heart disease Paternal Grandfather   . Hypertension Mother   . Fibroids Mother   . Multiple sclerosis Mother   .  Hypertension Father   . Anesthesia problems Neg Hx   . Hypotension Neg Hx   . Malignant hyperthermia Neg Hx   . Pseudochol deficiency Neg Hx       Review of Systems  Constitutional: negative for fatigue and weight loss Respiratory: negative for cough and wheezing Cardiovascular: negative for chest pain, fatigue and palpitations Gastrointestinal: negative for abdominal pain and change in bowel habits Musculoskeletal:negative for myalgias Neurological: negative for gait problems and tremors Behavioral/Psych: negative for abusive relationship, depression Endocrine: negative for temperature intolerance     Genitourinary:negative for abnormal menstrual periods, genital lesions, hot flashes, sexual problems and vaginal discharge Integument/breast: negative for breast lump, breast tenderness, nipple discharge and skin lesion(s)    Objective:       BP 127/81   Pulse 70   Ht 6\' 4"  (1.93 m)   Wt 253 lb 11.2 oz (115.1 kg)   LMP 01/25/2018   BMI 30.88 kg/m  General:   alert  Skin:   no rash or abnormalities  Lungs:   clear to auscultation bilaterally  Heart:   regular rate and rhythm, S1, S2 normal, no murmur, click, rub or gallop  Breasts:   normal without suspicious masses, skin or nipple changes or axillary nodes  Abdomen:  normal findings: no organomegaly, soft, non-tender and no hernia  Pelvis:  External genitalia: normal general appearance Urinary system: urethral meatus normal and bladder without fullness, nontender Vaginal: normal without tenderness, induration or masses Cervix: normal appearance Adnexa: normal bimanual exam Uterus: anteverted and non-tender, normal size   Lab Review Urine pregnancy test Labs reviewed yes Radiologic studies reviewed no  50% of 20 min visit spent on counseling and coordination of care.   Assessment:     1. Encounter for routine gynecological examination with Papanicolaou smear of cervix Rx: - Cytology - PAP( Erica Wyatt)  2. Encounter for surveillance of implantable subdermal contraceptive - doing well  3. Vaginal discharge Rx: - Cervicovaginal ancillary only( [place])   Plan:    Education reviewed: calcium supplements, depression evaluation, low fat, low cholesterol diet, safe sex/STD prevention, self breast exams and weight bearing exercise. Contraception: Nexplanon. Follow up in: 1 year.   No orders of the defined types were placed in this encounter.  No orders of the defined types were placed in this encounter.  Brock Bad MD 01-31-2018

## 2018-01-31 NOTE — Addendum Note (Signed)
Addended by: Coral Ceo A on: 01/31/2018 10:24 AM   Modules accepted: Orders

## 2018-01-31 NOTE — Progress Notes (Signed)
Last PAP: 11/11/16 Normal  Next Due: 11/12/2019 Influenza vaccine?

## 2018-02-01 LAB — CERVICOVAGINAL ANCILLARY ONLY
BACTERIAL VAGINITIS: POSITIVE — AB
CANDIDA VAGINITIS: NEGATIVE
CHLAMYDIA, DNA PROBE: NEGATIVE
Neisseria Gonorrhea: NEGATIVE

## 2018-02-02 ENCOUNTER — Other Ambulatory Visit: Payer: Self-pay | Admitting: Obstetrics

## 2018-02-02 LAB — CYTOLOGY - PAP
Diagnosis: NEGATIVE
HPV (WINDOPATH): NOT DETECTED

## 2018-02-10 ENCOUNTER — Other Ambulatory Visit: Payer: 59 | Admitting: Orthotics

## 2018-02-10 ENCOUNTER — Encounter: Payer: Self-pay | Admitting: Podiatry

## 2018-02-10 ENCOUNTER — Ambulatory Visit (INDEPENDENT_AMBULATORY_CARE_PROVIDER_SITE_OTHER): Payer: 59 | Admitting: Podiatry

## 2018-02-10 DIAGNOSIS — Z9889 Other specified postprocedural states: Secondary | ICD-10-CM | POA: Diagnosis not present

## 2018-02-10 DIAGNOSIS — M66872 Spontaneous rupture of other tendons, left ankle and foot: Secondary | ICD-10-CM

## 2018-02-10 NOTE — Progress Notes (Signed)
She presents today date of surgery October 08, 2017 status post repair of posterior tibial tendon left.  She states that she is doing very well not a lot of problems she feels like she is walking more normally and she is finishing up physical therapy today.  Objective: Vital signs are stable she is alert and oriented x3.  She is has good strength on inversion against resistance though she still is unable to toe stand comfortably.  She has no pain on palpation of the calf.  There is no erythema cellulitis drainage or odor to some mild edema along the incision site which appears to be healing very nicely.  There are no open lesions or wounds.  Assessment: Well-healing surgical foot left.  Plan: At this point I would request that she continue physical therapy and continue light duty at work and I will follow-up with her in 1 month.

## 2018-03-15 ENCOUNTER — Ambulatory Visit (INDEPENDENT_AMBULATORY_CARE_PROVIDER_SITE_OTHER): Payer: 59 | Admitting: Podiatry

## 2018-03-15 ENCOUNTER — Encounter: Payer: Self-pay | Admitting: Podiatry

## 2018-03-15 DIAGNOSIS — M66872 Spontaneous rupture of other tendons, left ankle and foot: Secondary | ICD-10-CM

## 2018-03-15 DIAGNOSIS — Z9889 Other specified postprocedural states: Secondary | ICD-10-CM | POA: Diagnosis not present

## 2018-03-15 DIAGNOSIS — M722 Plantar fascial fibromatosis: Secondary | ICD-10-CM | POA: Diagnosis not present

## 2018-03-15 NOTE — Progress Notes (Signed)
She presents today for postop visit date of surgery October 08, 2017 status post excision of a small lesion as well as repair of posterior tibial tendon.  She states that it seems to be doing better though sometimes a quick jog will make it not feel so good.  She states for the most part however is doing really well.  Has finished up physical therapy.  Objective: Vital signs are stable she is alert and oriented x3 still has some edema to the left lower extremity and some tenderness on palpation of the posterior tibial tendon at its insertion site.  Assessment: Well-healing surgical foot.  Plan: At this point I need to get see about getting her a set of orthotics built.  I [facility] to [facility] today to have orthotics made.  I will follow-up with her in a few weeks however I want her to continue current duties.

## 2018-03-24 ENCOUNTER — Encounter: Payer: Self-pay | Admitting: Podiatry

## 2018-03-31 ENCOUNTER — Encounter: Payer: 59 | Admitting: Orthotics

## 2018-03-31 ENCOUNTER — Encounter: Payer: Self-pay | Admitting: Podiatry

## 2018-03-31 ENCOUNTER — Ambulatory Visit (INDEPENDENT_AMBULATORY_CARE_PROVIDER_SITE_OTHER): Payer: 59 | Admitting: Podiatry

## 2018-03-31 DIAGNOSIS — Z9889 Other specified postprocedural states: Secondary | ICD-10-CM

## 2018-03-31 DIAGNOSIS — M66872 Spontaneous rupture of other tendons, left ankle and foot: Secondary | ICD-10-CM | POA: Diagnosis not present

## 2018-04-01 ENCOUNTER — Telehealth: Payer: Self-pay | Admitting: Podiatry

## 2018-04-01 NOTE — Telephone Encounter (Signed)
Notified pt orthotics in and pt is coming to pick them up today.

## 2018-04-02 NOTE — Progress Notes (Signed)
She presents today date of surgery October 08, 2017 status post repair posterior tibial tendon she states that she is doing fine she is just waiting on her orthotics so that she can get back to regular duty.  Objective: Strong inversion capability no erythema edema cellulitis drainage odor mild tenderness on palpation.  Assessment: Well-healing surgical foot.  Plan: Follow-up with orthotics.

## 2018-04-15 ENCOUNTER — Telehealth: Payer: Self-pay | Admitting: Podiatry

## 2018-04-15 NOTE — Telephone Encounter (Signed)
I called pt, asked if she had picked up her orthotics and how she was doing. Pt states she is doing good with the orthotics and is scheduled to see [facility] for orthotics follow up 04/21/2018, and would like to return to work full duty on 04/25/2017. I told pt Dr. Al Corpus was in surgery and I would send him a message and if he okayed the return to work on the 13th, I would write a note and contact her.

## 2018-04-15 NOTE — Telephone Encounter (Signed)
Pt would like a note to go back to work.  °

## 2018-04-18 ENCOUNTER — Encounter: Payer: Self-pay | Admitting: *Deleted

## 2018-04-18 NOTE — Telephone Encounter (Signed)
Fine but she still needs to be very careful with running and high impact activities.

## 2018-04-18 NOTE — Telephone Encounter (Signed)
I informed pt of Dr. Geryl Rankins recommendations and orders. Pt states she can pick up the letter tomorrow at her appt.

## 2018-04-21 ENCOUNTER — Ambulatory Visit: Payer: Self-pay | Admitting: Orthotics

## 2018-04-21 DIAGNOSIS — Z9889 Other specified postprocedural states: Secondary | ICD-10-CM

## 2018-04-21 DIAGNOSIS — M76822 Posterior tibial tendinitis, left leg: Secondary | ICD-10-CM

## 2018-04-21 DIAGNOSIS — M79676 Pain in unspecified toe(s): Secondary | ICD-10-CM

## 2018-04-21 NOTE — Progress Notes (Signed)
Patient wanted me to review her fit/function of post surgery f/o.  I did and functionally they assist getting her planus foot back into more rectus position.  She wants a second pair for her work flats.   Charge 199.00

## 2018-04-25 ENCOUNTER — Telehealth: Payer: Self-pay

## 2018-04-25 NOTE — Telephone Encounter (Signed)
Pt called and left message on triage line for return call. Attempted to call pt, no answer. LM for pt to return call to office

## 2018-04-25 NOTE — Telephone Encounter (Signed)
Pt would like a phone call to discuss her mother's recent dx of stage 4 breast cancer. Pt would like a phone call personally from Dr. Clearance Coots. I did inform pt that he is seeing patients right now, and that it may be a little while. Patients mother is also a patient of Dr. Clearance Coots.

## 2018-05-12 ENCOUNTER — Ambulatory Visit: Payer: 59 | Admitting: Podiatry

## 2018-05-12 ENCOUNTER — Telehealth: Payer: Self-pay | Admitting: *Deleted

## 2018-05-12 ENCOUNTER — Encounter: Payer: Self-pay | Admitting: Podiatry

## 2018-05-12 ENCOUNTER — Other Ambulatory Visit: Payer: 59 | Admitting: Orthotics

## 2018-05-12 DIAGNOSIS — Z9889 Other specified postprocedural states: Secondary | ICD-10-CM

## 2018-05-12 DIAGNOSIS — M799 Soft tissue disorder, unspecified: Secondary | ICD-10-CM

## 2018-05-12 DIAGNOSIS — M66872 Spontaneous rupture of other tendons, left ankle and foot: Secondary | ICD-10-CM

## 2018-05-12 DIAGNOSIS — M76822 Posterior tibial tendinitis, left leg: Secondary | ICD-10-CM

## 2018-05-12 NOTE — Telephone Encounter (Signed)
-----   Message from Kristian Covey, [facility] sent at 05/12/2018  2:52 PM EST ----- Regarding: PT PT referral -   [facility] [address] [place], [place], [place] [address] [phone]  S/p dos 06.28.2019 Exc. Benign Lesion 1.0 cm Lt, Repair Posterior Tibial Tendon Lt, Kidner Advanced Tendon Lt  Duration: 3 x week x 4 weeks  Work on strengthening and endurance and pain

## 2018-05-12 NOTE — Telephone Encounter (Signed)
Faxed [facility] form.

## 2018-05-12 NOTE — Progress Notes (Signed)
She presents today date of surgery October 08, 2017 status post posterior tibial tendon repair with cast left she is recently received a new orthotics and states that they seem to be doing very well.  She states that is a little stiff the morning after she has been active on it.  But she walks with no no problems she states.  Objective: Vital signs are stable she is alert and oriented x3.  Pulses are palpable.  She still has some mild tenderness on palpation of the surgical site and inversion against resistance.  No edema no erythema cellulitis drainage or odor no open lesions or wounds no calf pain.  Assessment: Well-healing surgical foot.  I think that she needs to have some conditioning done so we will send her to physical therapy to have conditioning of her muscles prior to her returning to work.  She will remain in light duty until further notice.

## 2018-06-23 ENCOUNTER — Ambulatory Visit: Payer: 59 | Admitting: Podiatry

## 2018-06-30 ENCOUNTER — Encounter: Payer: Self-pay | Admitting: Podiatry

## 2018-06-30 ENCOUNTER — Other Ambulatory Visit: Payer: Self-pay

## 2018-06-30 ENCOUNTER — Ambulatory Visit: Payer: 59 | Admitting: Podiatry

## 2018-06-30 DIAGNOSIS — Z9889 Other specified postprocedural states: Secondary | ICD-10-CM

## 2018-06-30 DIAGNOSIS — M66872 Spontaneous rupture of other tendons, left ankle and foot: Secondary | ICD-10-CM | POA: Diagnosis not present

## 2018-06-30 NOTE — Progress Notes (Signed)
She presents today date of surgery October 08, 2017 status post posterior tibial tendon repair.  She continues physical therapy and states that all she is doing is some stretching exercises continues to wear her orthotics on a regular basis.  States that I think is going on be about as good as the gets.  As she refers to the posterior tibial tendon and some tenderness that is still present.  Objective: Vitals are stable she is alert and oriented x3 left foot does demonstrate some swelling around the posterior tibial tendon but does not appear to be fluctuant in nature.  She has good inversion against resistance and appears to be strong.  Assessment: Well-healing surgical foot.  Plan: Encouraged her to continue to wear her orthotics at all times we will let her go back to work provided she is released from physical therapy and feels fit by the middle of April.  If there is any setback then we reserve the right to continue her out of work.  I will follow-up with her at that time.

## 2018-08-02 ENCOUNTER — Encounter: Payer: Self-pay | Admitting: Podiatry

## 2018-08-02 ENCOUNTER — Ambulatory Visit: Payer: 59 | Admitting: Podiatry

## 2018-08-02 ENCOUNTER — Other Ambulatory Visit: Payer: Self-pay

## 2018-08-02 VITALS — Temp 98.1°F

## 2018-08-02 DIAGNOSIS — Z9889 Other specified postprocedural states: Secondary | ICD-10-CM

## 2018-08-02 DIAGNOSIS — M66872 Spontaneous rupture of other tendons, left ankle and foot: Secondary | ICD-10-CM

## 2018-08-02 DIAGNOSIS — M25371 Other instability, right ankle: Secondary | ICD-10-CM | POA: Diagnosis not present

## 2018-08-03 ENCOUNTER — Encounter: Payer: Self-pay | Admitting: Podiatry

## 2018-08-03 NOTE — Progress Notes (Signed)
She presents today for follow-up date of surgery 10/08/2017 left foot.  She states that is doing perfectly well she is getting along with a quite nicely however over the past week or so she has rolled her right ankle twice last Tuesday and last Thursday and states that she feels that this may be from old basketball injuries.  She states that it feels very weak and has been so for years but is starting to become more painful and she is rolling it much more frequently.  Objective: Vital signs are stable she is alert and oriented x3 left foot demonstrates well-healing surgical repair of the posterior tibial tendon she has good inversion against resistance no pain on palpation of the area.  Nice and strong.  Right foot however does demonstrate what appears to be a loss of anterior talofibular ligament there appears to be some talar tilt present and there appears to be a little bit of posterior anterior motion of the ankle itself.  Mild swelling no erythema cellulitis drainage odor no ecchymosis.  Assessment: Well-healing surgical foot left.  She is discharged for this foot.  Right foot however I am going to request that she performing at the typical rest ice compression elevation and utilize a ankle stabilizer for this for the next 2 months if she is not improved MRI will be necessary.

## 2018-09-27 ENCOUNTER — Other Ambulatory Visit (HOSPITAL_BASED_OUTPATIENT_CLINIC_OR_DEPARTMENT_OTHER): Payer: Self-pay

## 2018-09-27 DIAGNOSIS — R0683 Snoring: Secondary | ICD-10-CM

## 2018-10-04 ENCOUNTER — Ambulatory Visit: Payer: 59 | Admitting: Podiatry

## 2018-10-11 ENCOUNTER — Other Ambulatory Visit: Payer: Self-pay

## 2018-10-11 ENCOUNTER — Ambulatory Visit: Payer: 59 | Admitting: Podiatry

## 2018-10-11 DIAGNOSIS — S93491S Sprain of other ligament of right ankle, sequela: Secondary | ICD-10-CM | POA: Diagnosis not present

## 2018-10-11 NOTE — Progress Notes (Signed)
She presents today chief concern of pain to the right ankle.  She states that the ankles been bothering her for quite some time and seems to be getting worse that she is getting older.  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  Currently she is a Engineer, structural and she is unable to run without the sensation that her foot is going to roll over on her.  Objective: Vital signs are stable she is alert and oriented x3 she has good strong pulses to the right lower extremity.  Neurologic sensorium is intact dT reflexes are intact muscle strength is normal.  She does have a bit of a talar tilt on forced examination.  She has some tenderness on palpation of the anterior talofibular ligament some scar tissue appears to be down deep in there but I am concerned about a complete loss of the ATFL.  There is no fractures noted.  I reviewed radiographs not demonstrating any fracture and no talar tilt was performed during those radiographs.  Assessment: Lateral ankle instability right foot.  History of trauma.  Plan: Requesting MRI for surgical consideration ankle level.

## 2018-10-26 ENCOUNTER — Ambulatory Visit (HOSPITAL_BASED_OUTPATIENT_CLINIC_OR_DEPARTMENT_OTHER): Payer: 59 | Attending: *Deleted

## 2018-11-01 ENCOUNTER — Other Ambulatory Visit: Payer: Self-pay | Admitting: Podiatry

## 2018-11-10 ENCOUNTER — Other Ambulatory Visit: Payer: Self-pay

## 2018-11-10 ENCOUNTER — Other Ambulatory Visit: Payer: 59

## 2018-11-10 ENCOUNTER — Ambulatory Visit
Admission: RE | Admit: 2018-11-10 | Discharge: 2018-11-10 | Disposition: A | Discharge status: Home / Self Care | Payer: 59 | Source: Ambulatory Visit | Attending: Podiatry | Admitting: Podiatry

## 2018-11-10 DIAGNOSIS — S93491S Sprain of other ligament of right ankle, sequela: Secondary | ICD-10-CM

## 2018-11-10 IMAGING — MR MRI OF THE RIGHT ANKLE WITHOUT CONTRAST
4 of 5 series · 12 of 40 positions shown · non-contrast
Comparison: None.

CLINICAL DATA: Multiple ankle sprains while playing sports. Lateral
ankle pain.

EXAM:
MRI OF THE RIGHT ANKLE WITHOUT CONTRAST
TECHNIQUE: Multiplanar, multisequence MR imaging of the ankle was performed. No
intravenous contrast was administered.

[Series 3: PD fat-sat · axial · right · 3.0mm · 0.25mm/px · z∈[-111,+1]mm · 3 of 36 slices shown]
[im 4/36]
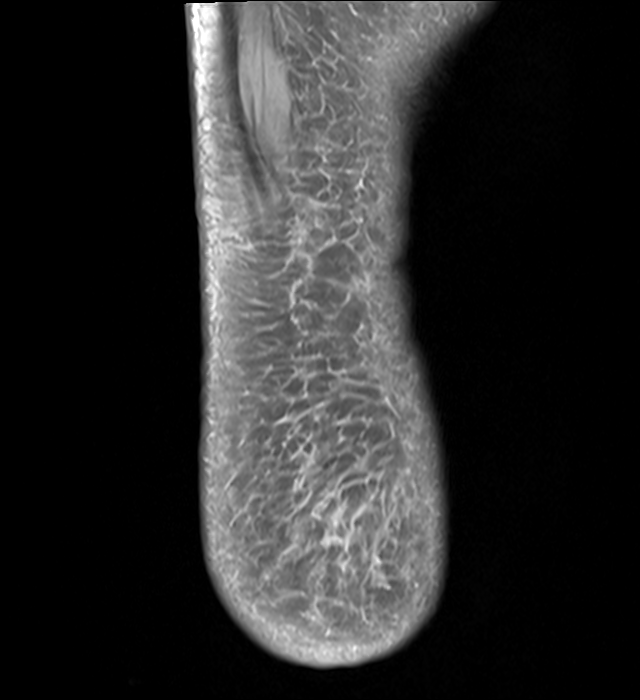
[im 20/36]
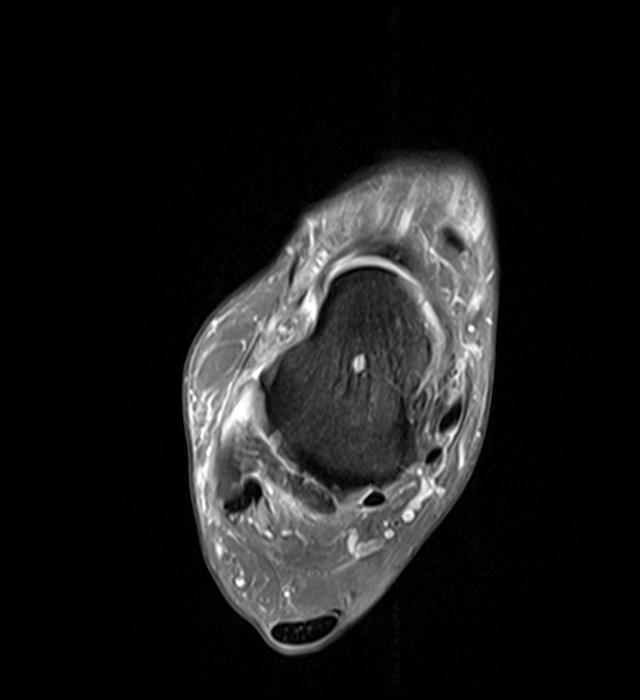
[im 32/36]
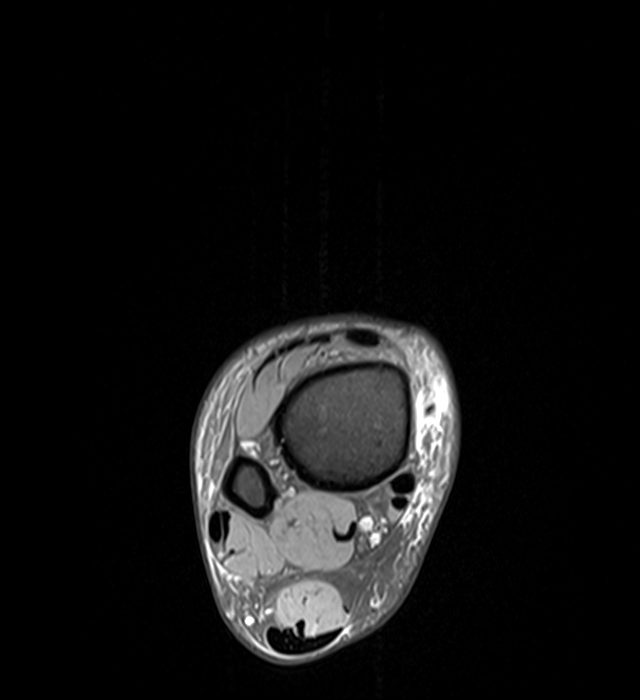

[Series 4: T2 fat-sat · axial · right · 3.0mm · 0.25mm/px · z∈[-107,-3]mm · 3 of 36 slices shown (1 of 2)]
[im 5/36]
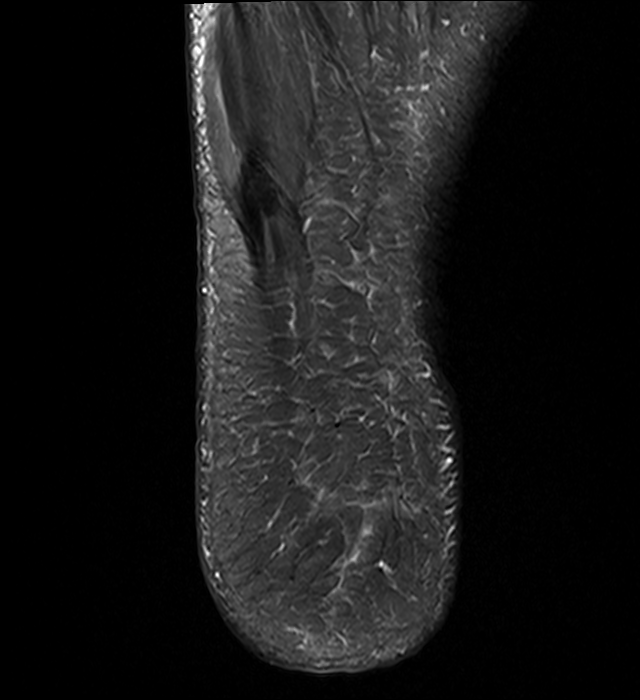
[im 18/36]
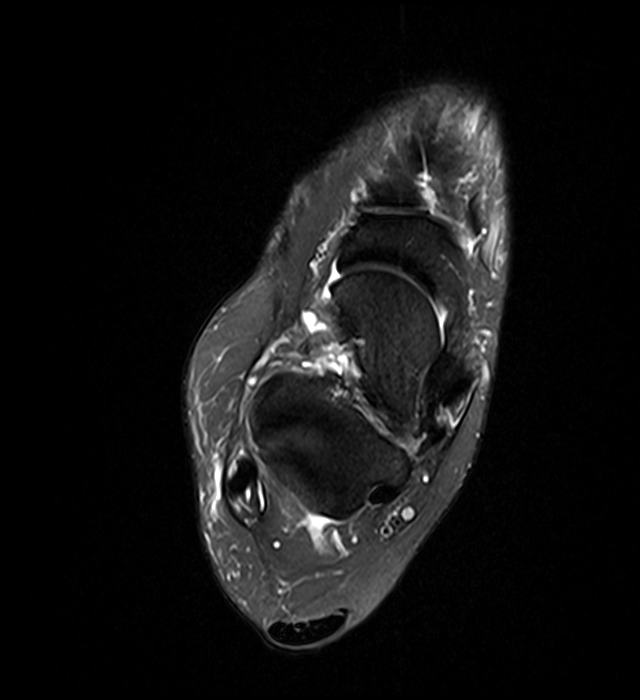
[im 31/36]
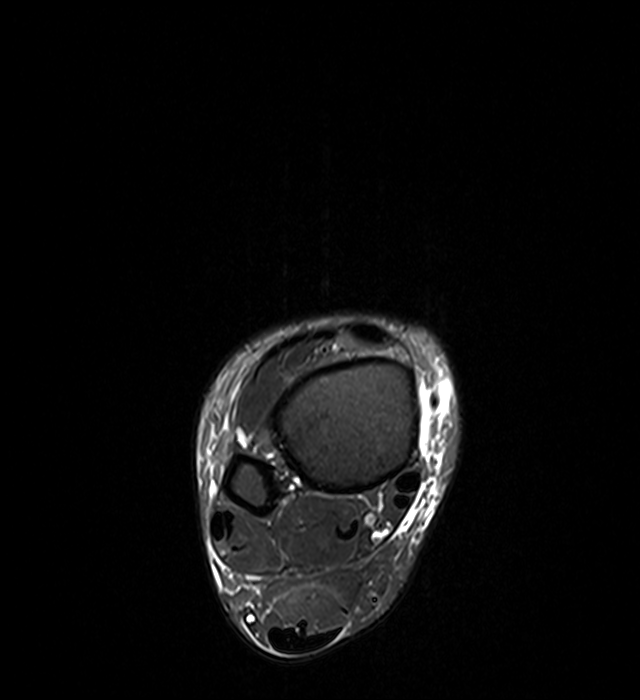

[Series 5: T1 · sagittal · right · 4.0mm · 0.27mm/px · 3 of 24 slices shown]
[im 5/24]
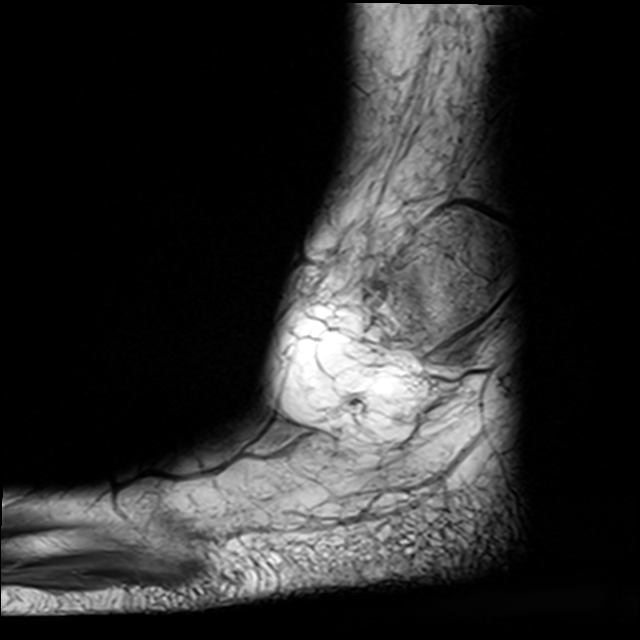
[im 14/24]
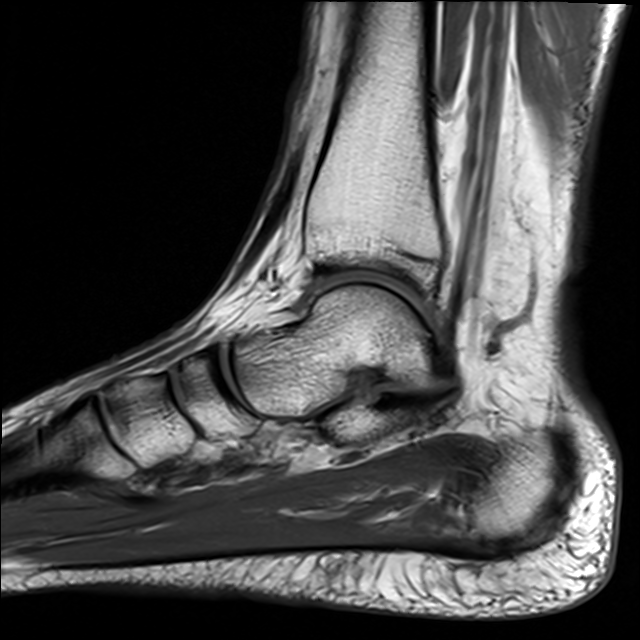
[im 24/24]
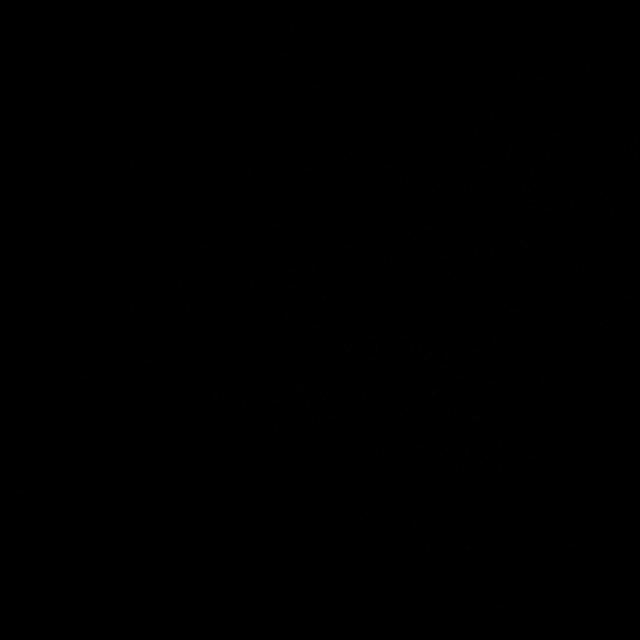

[Series 7: T2 fat-sat · coronal · right · 3.0mm · 0.25mm/px · 3 of 37 slices shown (2 of 2)]
[im 5/37]
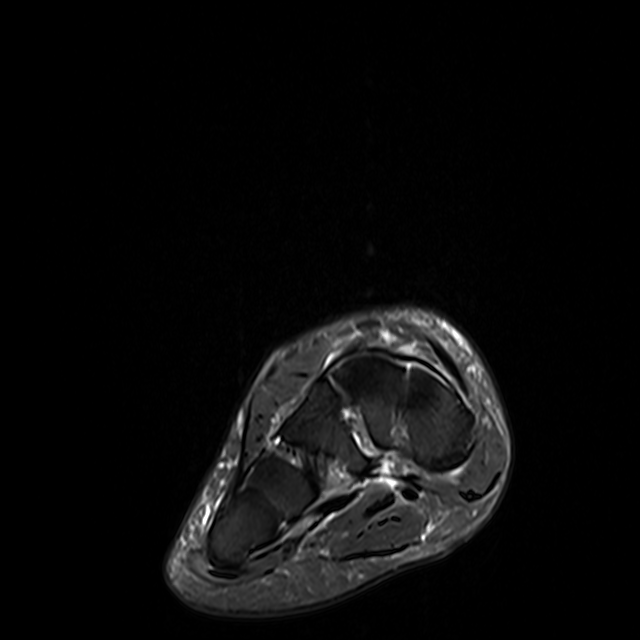
[im 19/37]
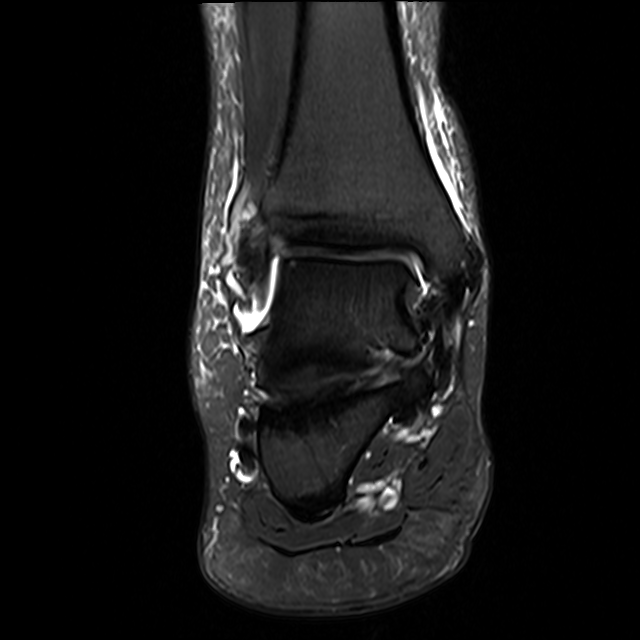
[im 32/37]
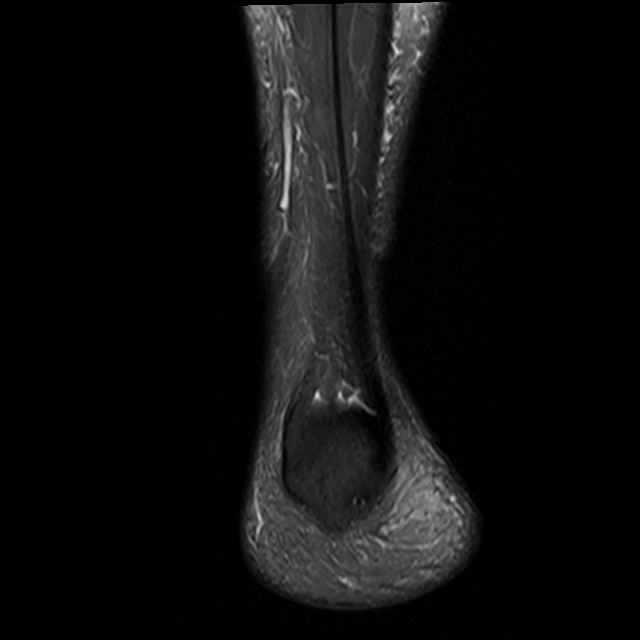

[12 of 40 positions shown; findings below may reference images not displayed]

FINDINGS: TENDONS

Peroneal: Mild tendinosis of the peroneus longus with mild
tenosynovitis. Peroneal brevis intact.

Posteromedial: Posterior tibial tendon intact. Flexor hallucis
longus tendon intact. Flexor digitorum longus tendon intact.

Anterior: Tibialis anterior tendon intact. Extensor hallucis longus
tendon intact Extensor digitorum longus tendon intact.

Achilles:  Intact.

Plantar Fascia: Intact.

LIGAMENTS

Lateral: Chronic complete tear of the anterior talofibular ligament.
Attenuated calcaneofibular ligament likely reflecting chronic tear.
Posterior talofibular ligament intact. Anterior and posterior
tibiofibular ligaments intact.

Medial: Chronic partial tear of the deltoid ligament. Spring
ligament intact.

CARTILAGE

Ankle Joint: No joint effusion. Ankle mortise is intact. Mild
cartilage irregularity of the lateral corner of the talar dome.

Subtalar Joints/Sinus Tarsi: Normal subtalar joints. No subtalar
joint effusion. Normal sinus tarsi.

Bones: No marrow signal abnormality.  No fracture or dislocation.

Soft Tissue: Muscles are normal. No muscle atrophy. No fluid
collection or hematoma. Neurovascular bundles demonstrate no focal
abnormality.
IMPRESSION: 1. Mild tendinosis of the peroneus longus with mild tenosynovitis.
2. Chronic complete tear of the anterior talofibular ligament.

## 2018-11-14 ENCOUNTER — Telehealth: Payer: Self-pay | Admitting: *Deleted

## 2018-11-14 DIAGNOSIS — M66872 Spontaneous rupture of other tendons, left ankle and foot: Secondary | ICD-10-CM

## 2018-11-14 DIAGNOSIS — S93491S Sprain of other ligament of right ankle, sequela: Secondary | ICD-10-CM

## 2018-11-14 NOTE — Telephone Encounter (Signed)
-----   Message from Garrel Ridgel, [place] sent at 11/14/2018  7:27 AM EDT ----- Old tear of the ATFL most likely making the ankle feel weak.  Two options:  Surgery to tighten the lateral ankle or Try PT first to strengthen the lateral ankle to help with weakness and to help prevent rolling of the ankle.Marland Kitchen

## 2018-11-14 NOTE — Telephone Encounter (Signed)
Orders delivered to [facility] - In-office.

## 2018-12-21 ENCOUNTER — Ambulatory Visit (HOSPITAL_BASED_OUTPATIENT_CLINIC_OR_DEPARTMENT_OTHER): Payer: 59 | Attending: *Deleted | Admitting: Internal Medicine

## 2018-12-21 ENCOUNTER — Other Ambulatory Visit: Payer: Self-pay

## 2018-12-21 VITALS — Ht 76.0 in | Wt 255.0 lb

## 2018-12-21 DIAGNOSIS — R0683 Snoring: Secondary | ICD-10-CM | POA: Insufficient documentation

## 2018-12-21 DIAGNOSIS — G4733 Obstructive sleep apnea (adult) (pediatric): Secondary | ICD-10-CM

## 2018-12-31 DIAGNOSIS — R0683 Snoring: Secondary | ICD-10-CM

## 2018-12-31 NOTE — Procedures (Signed)
    Patient Name: Erica, Wyatt Date: 12/21/2018 Gender: Female D.O.B: 1985-09-22 Age (years): 33 Referring Provider: Everardo Beals NP Height (inches): 46 Interpreting Physician: Baird Lyons MD, ABSM Weight (lbs): 250 RPSGT: Jacolyn Reedy BMI: 30 MRN: [id number] Neck Size: 17.50  CLINICAL INFORMATION Sleep Study Type: HST Indication for sleep study: OSA with Insomnia Epworth Sleepiness Score: 16  SLEEP STUDY TECHNIQUE A multi-channel overnight portable sleep study was performed. The channels recorded were: nasal airflow, thoracic respiratory movement, and oxygen saturation with a pulse oximetry. Snoring was also monitored.  MEDICATIONS Patient self administered medications include: none reported.  SLEEP ARCHITECTURE Patient was studied for 415.3 minutes. The sleep efficiency was 100.0 % and the patient was supine for 99.7%. The arousal index was 0.0 per hour.  RESPIRATORY PARAMETERS The overall AHI was 9.4 per hour, with a central apnea index of 0.0 per hour. The oxygen nadir was 91% during sleep.  CARDIAC DATA Mean heart rate during sleep was 73.7 bpm.  IMPRESSIONS - Mild obstructive sleep apnea occurred during this study (AHI = 9.4/h). - No significant central sleep apnea occurred during this study (CAI = 0.0/h). - The patient had minimal or no oxygen desaturation during the study (Min O2 = 91%) - Patient snored.  DIAGNOSIS - Obstructive Sleep Apnea (327.23 [G47.33 ICD-10])  RECOMMENDATIONS - Treatment for mild OSA is directed at symptoms. Conservative measures may include observation, weight loss and sleep position off back. - Other options, such as CPAP, referral or a fitted oral appliance, will be guided by clinical judgment. - Be careful with alcohol, sedatives and other CNS depressants that may worsen sleep apnea and disrupt normal sleep architecture. - Sleep hygiene should be reviewed to assess factors that may improve sleep quality. - Weight  management and regular exercise should be initiated or continued.  [Electronically signed] 12/31/2018 02:34 PM  Baird Lyons MD, [place], American Board of Sleep Medicine   NPI: [id number]                         [place], [place] [facility]  ELECTRONICALLY SIGNED ON:  12/31/2018, 2:32 PM [place] PH: [phone]   FX: [phone] [place]

## 2019-01-05 ENCOUNTER — Telehealth: Payer: Self-pay | Admitting: Podiatry

## 2019-01-05 DIAGNOSIS — M66872 Spontaneous rupture of other tendons, left ankle and foot: Secondary | ICD-10-CM

## 2019-01-05 DIAGNOSIS — S93491S Sprain of other ligament of right ankle, sequela: Secondary | ICD-10-CM

## 2019-01-05 NOTE — Telephone Encounter (Signed)
Pt called requesting that an updated referral be sent to her physical therapy office to continue therapy on her left foot. Pt states she is still having trouble with that foot and her therapy had been limited due to Covid. Please give patient a call.

## 2019-01-05 NOTE — Addendum Note (Signed)
Addended by: Harriett Sine D on: 01/05/2019 10:38 AM   Modules accepted: Orders

## 2019-01-19 ENCOUNTER — Encounter: Payer: Self-pay | Admitting: Podiatry

## 2019-01-19 ENCOUNTER — Ambulatory Visit: Payer: 59 | Admitting: Podiatry

## 2019-01-19 ENCOUNTER — Other Ambulatory Visit: Payer: Self-pay

## 2019-01-19 ENCOUNTER — Telehealth: Payer: Self-pay | Admitting: *Deleted

## 2019-01-19 DIAGNOSIS — M25371 Other instability, right ankle: Secondary | ICD-10-CM

## 2019-01-19 DIAGNOSIS — M79609 Pain in unspecified limb: Secondary | ICD-10-CM

## 2019-01-19 NOTE — Telephone Encounter (Signed)
-----   Message from Rip Harbour, [facility] sent at 01/19/2019 12:42 PM EDT ----- Regarding: Ortho referral Referral to [facility] - see Dr. Mardelle Matte - evaluate left knee and hip pain

## 2019-01-19 NOTE — Telephone Encounter (Signed)
Prepared referral to [facility] - Dr. Mardelle Matte, available 6 months clinicals and demographics to be faxed once 01/19/2019 clinicals are available.

## 2019-01-19 NOTE — Progress Notes (Signed)
She presents today for follow-up of her right foot and ankle issues as both sides are stretching and pulling since about a day or so ago she had the right foot is pretty much okay the left one is still a little sore from the surgery but she is noticing a lot of knee pain and left hip pain.  Objective: Vital signs are stable she is alert and oriented x3 mild reproducible pain on the left foot but good dorsiflexion and plantarflexion with inversion and eversion as well this is noted to be bilaterally.  No open lesions or wounds are noted.  Assessment: Residual foot weakness and pain possibly associated with knee and hip issues as well.  Plan: We will refer her to Carter Kitten at [facility] for evaluation of her left knee and left hip.

## 2019-01-23 NOTE — Telephone Encounter (Signed)
Faxed referral with clinicals, demographics to [facility].

## 2019-03-02 ENCOUNTER — Ambulatory Visit: Payer: 59 | Admitting: Obstetrics

## 2019-03-17 ENCOUNTER — Ambulatory Visit: Payer: 59 | Admitting: Obstetrics

## 2019-04-04 ENCOUNTER — Ambulatory Visit (INDEPENDENT_AMBULATORY_CARE_PROVIDER_SITE_OTHER): Payer: 59 | Admitting: Obstetrics

## 2019-04-04 ENCOUNTER — Encounter: Payer: Self-pay | Admitting: Obstetrics

## 2019-04-04 ENCOUNTER — Other Ambulatory Visit: Payer: Self-pay

## 2019-04-04 VITALS — BP 110/73 | HR 67 | Ht 76.0 in | Wt 235.0 lb

## 2019-04-04 DIAGNOSIS — Z3202 Encounter for pregnancy test, result negative: Secondary | ICD-10-CM

## 2019-04-04 DIAGNOSIS — Z3046 Encounter for surveillance of implantable subdermal contraceptive: Secondary | ICD-10-CM | POA: Diagnosis not present

## 2019-04-04 LAB — POCT URINE PREGNANCY: Preg Test, Ur: NEGATIVE

## 2019-04-04 MED ORDER — ETONOGESTREL 68 MG ~~LOC~~ IMPL
68.0000 mg | DRUG_IMPLANT | Freq: Once | SUBCUTANEOUS | Status: AC
  Administered 2019-04-04: 16:00:00 68 mg via SUBCUTANEOUS

## 2019-04-04 NOTE — Progress Notes (Addendum)
GYN presents for Nexplanon Removal (had it more than 3 yrs.) and Insertion.   UPT today is NEGATIVE

## 2019-04-04 NOTE — Progress Notes (Signed)
Nexplanon Procedure Note    PROCEDURE: Nexplanon removal and reinsertion Performing Provider: Shelly Bombard MD  Patient education prior to procedure, explained risk, benefits of Nexplanon, reviewed alternative options. Patient reported understanding. Gave consent to continue with procedure.  Patient had Nexplanon inserted in 2017. Desires removal today w/ reinsertion  PROCEDURE:  Pregnancy Text :  not indicated Site (check):      left arm         Sterile Preparation:   Betadinex3 Lot # [id number] Expiration Date 2023 JAN 07    The patient's left arm was palpated and the implant device located. The area was prepped with Betadinex3. The distal end of the device was palpated and 1.5 cc of 1% lidocaine without epinephrine was injected. A 1.5 mm incision was made. Any fibrotic tissue was carefully dissected away using blunt and/or sharp dissection. The device was removed in an intact manner.   Insertion site was the same as the removal site. Nexplanon  was inserted subcutaneously.Needle was removed from the insertion site. Nexplanon capsule was palpated by provider and patient to assure satisfactory placement. Dressing applied.  Followup: The patient tolerated the procedure well without complications.  Standard post-procedure care is explained and return precautions are given.  Shelly Bombard, MD 04/04/2019 4:02 PM

## 2019-04-05 ENCOUNTER — Encounter: Payer: Self-pay | Admitting: Obstetrics

## 2020-02-29 ENCOUNTER — Encounter: Payer: 59 | Admitting: Podiatry

## 2020-02-29 ENCOUNTER — Ambulatory Visit: Payer: 59

## 2020-02-29 DIAGNOSIS — M25371 Other instability, right ankle: Secondary | ICD-10-CM

## 2020-03-13 ENCOUNTER — Ambulatory Visit: Payer: 59 | Admitting: Podiatry

## 2020-03-13 ENCOUNTER — Encounter: Payer: Self-pay | Admitting: Podiatry

## 2020-03-13 ENCOUNTER — Other Ambulatory Visit: Payer: Self-pay

## 2020-03-13 ENCOUNTER — Ambulatory Visit (INDEPENDENT_AMBULATORY_CARE_PROVIDER_SITE_OTHER): Payer: 59

## 2020-03-13 DIAGNOSIS — M7751 Other enthesopathy of right foot: Secondary | ICD-10-CM

## 2020-03-13 DIAGNOSIS — Q688 Other specified congenital musculoskeletal deformities: Secondary | ICD-10-CM

## 2020-03-13 DIAGNOSIS — H6690 Otitis media, unspecified, unspecified ear: Secondary | ICD-10-CM | POA: Insufficient documentation

## 2020-03-13 DIAGNOSIS — J45909 Unspecified asthma, uncomplicated: Secondary | ICD-10-CM | POA: Insufficient documentation

## 2020-03-13 DIAGNOSIS — J0140 Acute pansinusitis, unspecified: Secondary | ICD-10-CM | POA: Insufficient documentation

## 2020-03-13 DIAGNOSIS — M25371 Other instability, right ankle: Secondary | ICD-10-CM

## 2020-03-13 HISTORY — DX: Otitis media, unspecified, unspecified ear: H66.90

## 2020-03-13 MED ORDER — TRIAMCINOLONE ACETONIDE 40 MG/ML IJ SUSP
20.0000 mg | Freq: Once | INTRAMUSCULAR | Status: AC
  Administered 2020-03-13: 20 mg

## 2020-03-13 NOTE — Progress Notes (Signed)
She presents today with a chief complaint of pain and weakness to the posterior posterior lateral aspect of the right ankle.  She states that her left hip really hurts physical therapy did not help at all.  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  She denies any trauma to the right foot and ankle.  Objective: Vital signs are stable alert oriented x3.  There is no erythema cellulitis drainage odor some mild edema about the right ankle.  She has pain on plantarflexion of the foot posteriorly.  She has mild tenderness on palpation of the deep posterior ankle.  The Achilles appears to be relatively normal she has good dorsiflexion with the knee fully extended.  Radiographs taken reviewed today do demonstrate an old fracture process posterior lateral process of the talus or a os trigonum.  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  Plan: Injected the area today with 10 mg of Kenalog and local anesthetic to the point of maximal tenderness.  I will follow-up with her in a few weeks at which time if this is not improved an MRI will be necessary of the rear foot to rule out any peroneal involvement.

## 2020-04-16 ENCOUNTER — Ambulatory Visit: Payer: 59 | Admitting: Podiatry

## 2020-04-26 NOTE — Progress Notes (Signed)
This encounter was created in error - please disregard.

## 2020-05-02 ENCOUNTER — Other Ambulatory Visit: Payer: Self-pay

## 2020-05-02 ENCOUNTER — Ambulatory Visit: Payer: 59 | Admitting: Podiatry

## 2020-05-02 ENCOUNTER — Encounter: Payer: Self-pay | Admitting: Podiatry

## 2020-05-02 DIAGNOSIS — M7671 Peroneal tendinitis, right leg: Secondary | ICD-10-CM

## 2020-05-02 MED ORDER — TRIAMCINOLONE ACETONIDE 40 MG/ML IJ SUSP
20.0000 mg | Freq: Once | INTRAMUSCULAR | Status: AC
  Administered 2020-05-02: 20 mg

## 2020-05-02 NOTE — Progress Notes (Signed)
She presents today for follow-up of her capsulitis subtalar joint of her right ankle. States that that part seems to be doing better slowly but more tender right and here as she points to the peroneal tendons just beneath the fibula. She states that she rolled it the other day and has been hurting a little bit since but all in all she says it feels like it is about the same thing.  Objective: Vital signs are stable alert oriented x3 she has no pain on end range of motion of the subtalar joint inversion or eversion. She does however have pain on plantarflexion and eversion of the foot particularly just beneath the lateral malleolus. There is an area of fluctuance within this tendon sheath also.  Assessment: Probable peroneal tendinitis peroneus longus.  Plan: I injected 2 mg of dexamethasone and local anesthetic to the point of maximal tenderness. She tolerated the procedure well. I will follow-up with her in a few weeks at which time if she is not improved then we will consider MRI.

## 2020-06-04 ENCOUNTER — Ambulatory Visit: Payer: 59 | Admitting: Podiatry

## 2020-08-12 ENCOUNTER — Other Ambulatory Visit: Payer: Self-pay

## 2020-08-12 ENCOUNTER — Ambulatory Visit (INDEPENDENT_AMBULATORY_CARE_PROVIDER_SITE_OTHER): Payer: 59 | Admitting: Obstetrics

## 2020-08-12 ENCOUNTER — Other Ambulatory Visit (HOSPITAL_COMMUNITY)
Admission: RE | Admit: 2020-08-12 | Discharge: 2020-08-12 | Disposition: A | Discharge status: Home / Self Care | Payer: 59 | Source: Ambulatory Visit | Attending: Obstetrics | Admitting: Obstetrics

## 2020-08-12 ENCOUNTER — Encounter: Payer: Self-pay | Admitting: Obstetrics

## 2020-08-12 VITALS — BP 116/79 | HR 79 | Ht 76.0 in | Wt 236.0 lb

## 2020-08-12 DIAGNOSIS — Z01419 Encounter for gynecological examination (general) (routine) without abnormal findings: Secondary | ICD-10-CM | POA: Insufficient documentation

## 2020-08-12 DIAGNOSIS — N898 Other specified noninflammatory disorders of vagina: Secondary | ICD-10-CM

## 2020-08-12 DIAGNOSIS — Z1239 Encounter for other screening for malignant neoplasm of breast: Secondary | ICD-10-CM

## 2020-08-12 DIAGNOSIS — Z113 Encounter for screening for infections with a predominantly sexual mode of transmission: Secondary | ICD-10-CM | POA: Diagnosis not present

## 2020-08-12 DIAGNOSIS — Z3046 Encounter for surveillance of implantable subdermal contraceptive: Secondary | ICD-10-CM

## 2020-08-12 NOTE — Progress Notes (Signed)
Subjective:        Erica Wyatt is a 35 y.o. female here for a routine exam.  Current complaints: Vaginal discharge with odor.  Denies vaginal irritation.  Personal health questionnaire:  Is patient Ashkenazi Jewish, have a family history of breast and/or ovarian cancer: yes, mother with Breast CA Is there a family history of uterine cancer diagnosed at age < 3, gastrointestinal cancer, urinary tract cancer, family member who is a Personnel officer syndrome-associated carrier: no Is the patient overweight and hypertensive, family history of diabetes, personal history of gestational diabetes, preeclampsia or PCOS: no Is patient over 43, have PCOS,  family history of premature CHD under age 54, diabetes, smoke, have hypertension or peripheral artery disease:  no At any time, has a partner hit, kicked or otherwise hurt or frightened you?: no Over the past 2 weeks, have you felt down, depressed or hopeless?: no Over the past 2 weeks, have you felt little interest or pleasure in doing things?:no   Gynecologic History No LMP recorded. Contraception: Nexplanon Last Pap: 01-31-2018. Results were: normal Last mammogram: none. Results were: none  Obstetric History OB History  Gravida Para Term Preterm AB Living  1 1 1  0 0 1  SAB IAB Ectopic Multiple Live Births  0 0 0 0 1    # Outcome Date GA Lbr Len/2nd Weight Sex Delivery Anes PTL Lv  1 Term 12/16/11 [redacted]w[redacted]d 19:20 / 01:14 8 lb 1.5 oz (3.671 kg) M Vag-Spont EPI  LIV     Birth Comments: normal newborn    Past Medical History:  Diagnosis Date  . Asthma    exercise inducted  . GERD (gastroesophageal reflux disease)   . Migraine headache   . Migraines   . Thrombocytopenia complicating pregnancy [facility])     Past Surgical History:  Procedure Laterality Date  . WISDOM TOOTH EXTRACTION       Current Outpatient Medications:  .  amphetamine-dextroamphetamine (ADDERALL) 10 MG tablet, dextroamphetamine-amphetamine 10 mg tablet  TAKE 1 TABLET BY  MOUTH TWICE A DAY, Disp: , Rfl:  .  albuterol (PROVENTIL HFA) 108 (90 BASE) MCG/ACT inhaler, Inhale 2 puffs into the lungs every 6 (six) hours as needed. Asthma attacks, Disp: 1 Inhaler, Rfl: 0 .  amitriptyline (ELAVIL) 25 MG tablet, Take 25 mg by mouth at bedtime., Disp: , Rfl:  .  etonogestrel (NEXPLANON) 68 MG IMPL implant, 1 each by Subdermal route once., Disp: , Rfl:  .  fluticasone (FLONASE) 50 MCG/ACT nasal spray, U 1 SPR IEN D, Disp: , Rfl: 0 .  ibuprofen (ADVIL,MOTRIN) 800 MG tablet, Take 1 tablet (800 mg total) by mouth every 8 (eight) hours as needed., Disp: 30 tablet, Rfl: 5 .  meloxicam (MOBIC) 15 MG tablet, Take 1 tablet (15 mg total) by mouth daily., Disp: 30 tablet, Rfl: 3 .  methylphenidate (CONCERTA) 36 MG PO CR tablet, Concerta 36 mg tablet,extended release  TAKE 1 TABLET BY MOUTH EVERY DAY, Disp: , Rfl:  .  methylphenidate 36 MG PO CR tablet, Take 36 mg by mouth daily., Disp: , Rfl:  .  tinidazole (TINDAMAX) 500 MG tablet, Take by mouth. (Patient not taking: Reported on 08/12/2020), Disp: , Rfl:  .  UBRELVY 100 MG TABS, Take by mouth., Disp: , Rfl:  Allergies  Allergen Reactions  . Amoxicillin     REACTION: childhood reaction,swelling  . Latex Other (See Comments)    Irritation to mucous membranes, especially vaginal tissue  . Penicillins Other (See Comments)  REACTION: childhood reaction,swelling Unknown; from childhood  . Topamax [Topiramate]     Suicidal thoughts  . Sulfa Antibiotics Swelling and Rash    Social History   Tobacco Use  . Smoking status: Never Smoker  . Smokeless tobacco: Never Used  Substance Use Topics  . Alcohol use: Yes    Alcohol/week: 0.0 standard drinks    Comment: socially     Family History  Problem Relation Age of Onset  . Heart disease Maternal Grandmother   . Fibroids Maternal Grandmother   . Heart disease Maternal Grandfather   . Cancer Paternal Grandmother   . Heart disease Paternal Grandfather   . Hypertension Mother   .  Fibroids Mother   . Multiple sclerosis Mother   . Breast cancer Mother   . Hypertension Father   . Anesthesia problems Neg Hx   . Hypotension Neg Hx   . Malignant hyperthermia Neg Hx   . Pseudochol deficiency Neg Hx       Review of Systems  Constitutional: negative for fatigue and weight loss Respiratory: negative for cough and wheezing Cardiovascular: negative for chest pain, fatigue and palpitations Gastrointestinal: negative for abdominal pain and change in bowel habits Musculoskeletal:negative for myalgias Neurological: negative for gait problems and tremors Behavioral/Psych: negative for abusive relationship, depression Endocrine: negative for temperature intolerance    Genitourinary:negative for abnormal menstrual periods, genital lesions, hot flashes, sexual problems and vaginal discharge Integument/breast: negative for breast lump, breast tenderness, nipple discharge and skin lesion(s)    Objective:       BP 116/79   Pulse 79   Ht 6\' 4"  (1.93 m)   Wt 236 lb (107 kg)   BMI 28.73 kg/m  General:   alert and no distress  Skin:   no rash or abnormalities  Lungs:   clear to auscultation bilaterally  Heart:   regular rate and rhythm, S1, S2 normal, no murmur, click, rub or gallop  Breasts:   normal without suspicious masses, skin or nipple changes or axillary nodes  Abdomen:  normal findings: no organomegaly, soft, non-tender and no hernia  Pelvis:  External genitalia: normal general appearance Urinary system: urethral meatus normal and bladder without fullness, nontender Vaginal: normal without tenderness, induration or masses Cervix: normal appearance Adnexa: normal bimanual exam Uterus: anteverted and non-tender, normal size   Lab Review Urine pregnancy test Labs reviewed yes Radiologic studies reviewed yes  I have spent a total of 20 minutes of face-to-face time, excluding clinical staff time, reviewing notes and preparing to see patient, ordering tests and/or  medications, and counseling the patient.  Assessment:    .1. Encounter for gynecological examination with Papanicolaou smear of cervix Rx: - Cytology - PAP( [place])  2. Vaginal discharge Rx: - Cervicovaginal ancillary only( [place])  3. Screen for STD (sexually transmitted disease) Rx: - RPR+HBsAg+HCVAb+...  4. Screening breast examination Rx: - MM Digital Screening; Future  5. Encounter for surveillance of Nexplanon subdermal contraceptive - pleased with Nexplanon    Plan:    Education reviewed: calcium supplements, depression evaluation, low fat, low cholesterol diet, safe sex/STD prevention, self breast exams and weight bearing exercise. Contraception: Nexplanon. Mammogram ordered. Follow up in: 1 year.    Orders Placed This Encounter  Procedures  . MM Digital Screening    Standing Status:   Future    Standing Expiration Date:   08/12/2021    Order Specific Question:   Reason for Exam (SYMPTOM  OR DIAGNOSIS REQUIRED)    Answer:  Screening    Order Specific Question:   Is the patient pregnant?    Answer:   No    Order Specific Question:   Preferred imaging location?    Answer:   [facility]  . RPR+HBsAg+HCVAb+...    Brock Bad, MD 08/12/2020 9:39 AM

## 2020-08-12 NOTE — Progress Notes (Addendum)
GYN presents AEX/PAP/STD Screening.  C/o discharge, itching.  Her Mother was Dx with Breast Cancer stage 3 in 2020.  PHQ-9=5

## 2020-08-13 ENCOUNTER — Other Ambulatory Visit: Payer: Self-pay | Admitting: Obstetrics

## 2020-08-13 ENCOUNTER — Ambulatory Visit (HOSPITAL_BASED_OUTPATIENT_CLINIC_OR_DEPARTMENT_OTHER): Admission: RE | Admit: 2020-08-13 | Payer: 59 | Source: Ambulatory Visit | Admitting: Radiology

## 2020-08-13 DIAGNOSIS — N760 Acute vaginitis: Secondary | ICD-10-CM

## 2020-08-13 DIAGNOSIS — B9689 Other specified bacterial agents as the cause of diseases classified elsewhere: Secondary | ICD-10-CM

## 2020-08-13 LAB — CERVICOVAGINAL ANCILLARY ONLY
Bacterial Vaginitis (gardnerella): POSITIVE — AB
Candida Glabrata: NEGATIVE
Candida Vaginitis: NEGATIVE
Chlamydia: NEGATIVE
Comment: NEGATIVE
Comment: NEGATIVE
Comment: NEGATIVE
Comment: NEGATIVE
Comment: NEGATIVE
Comment: NORMAL
Neisseria Gonorrhea: NEGATIVE
Trichomonas: NEGATIVE

## 2020-08-13 LAB — RPR+HBSAG+HCVAB+...
HIV Screen 4th Generation wRfx: NONREACTIVE
Hep C Virus Ab: <0.1 s/co ratio (ref 0.0–0.9)
Hepatitis B Surface Ag: NEGATIVE
RPR Ser Ql: NONREACTIVE

## 2020-08-13 MED ORDER — TINIDAZOLE 500 MG PO TABS
500.0000 mg | ORAL_TABLET | Freq: Two times a day (BID) | ORAL | 2 refills | Status: DC
Start: 2020-08-13 — End: 2023-07-22

## 2020-08-15 LAB — CYTOLOGY - PAP
Comment: NEGATIVE
Diagnosis: NEGATIVE
High risk HPV: NEGATIVE

## 2020-08-29 ENCOUNTER — Other Ambulatory Visit: Payer: Self-pay | Admitting: Obstetrics

## 2020-08-29 DIAGNOSIS — Z1231 Encounter for screening mammogram for malignant neoplasm of breast: Secondary | ICD-10-CM

## 2020-08-31 ENCOUNTER — Ambulatory Visit
Admission: RE | Admit: 2020-08-31 | Discharge: 2020-08-31 | Disposition: A | Discharge status: Home / Self Care | Payer: 59 | Source: Ambulatory Visit | Attending: Obstetrics | Admitting: Obstetrics

## 2020-08-31 ENCOUNTER — Other Ambulatory Visit: Payer: Self-pay

## 2020-08-31 DIAGNOSIS — Z1231 Encounter for screening mammogram for malignant neoplasm of breast: Secondary | ICD-10-CM

## 2020-08-31 IMAGING — MG MM DIGITAL SCREENING BILAT W/ TOMO AND CAD
8 series · 8 of 24 positions shown · non-contrast
Comparison: None.

CLINICAL DATA: Screening.

EXAM:
DIGITAL SCREENING BILATERAL MAMMOGRAM WITH TOMOSYNTHESIS AND CAD
TECHNIQUE: Bilateral screening digital craniocaudal and mediolateral oblique
mammograms were obtained. Bilateral screening digital breast
tomosynthesis was performed. The images were evaluated with
computer-aided detection.

[L CC synth-2D]
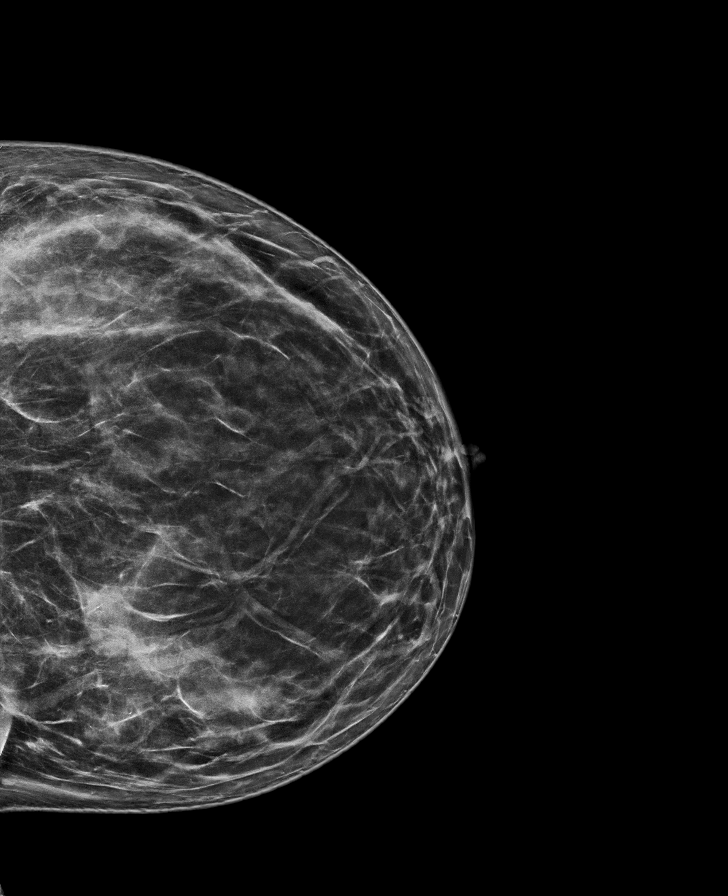

[R CC synth-2D]
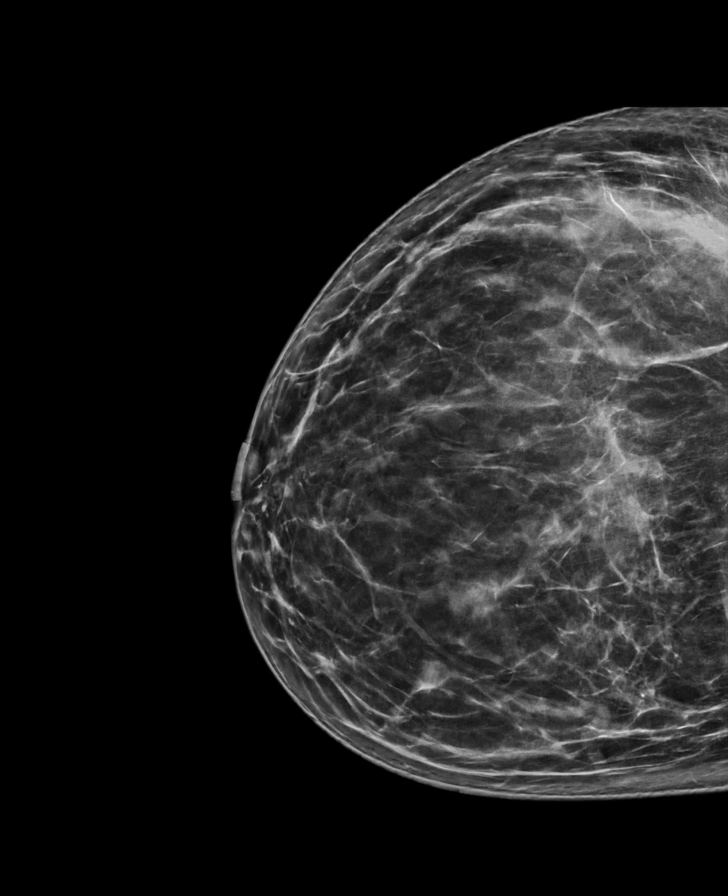

[L MLO synth-2D]
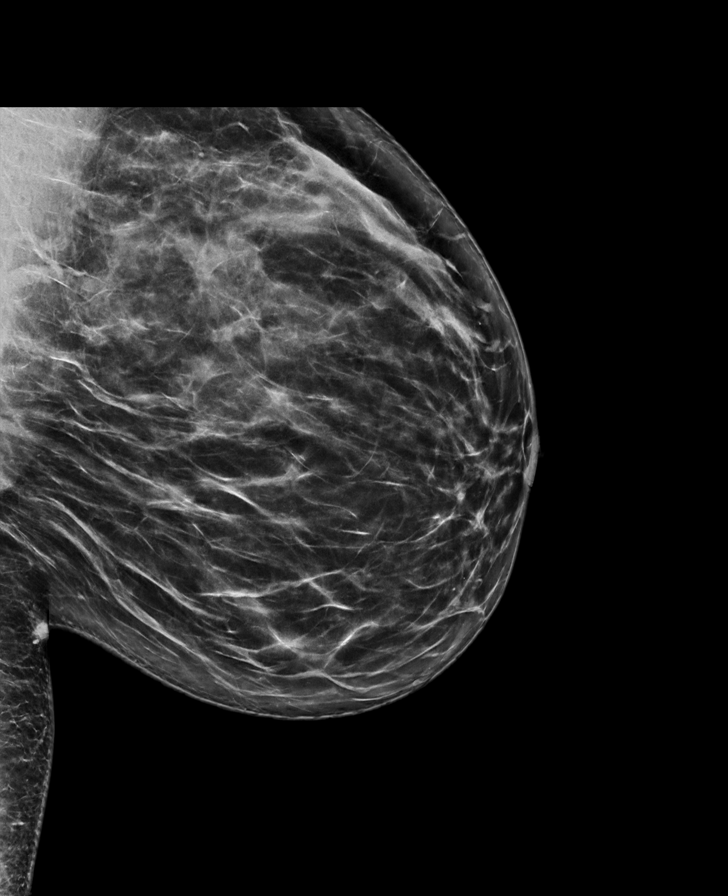

[R MLO synth-2D]
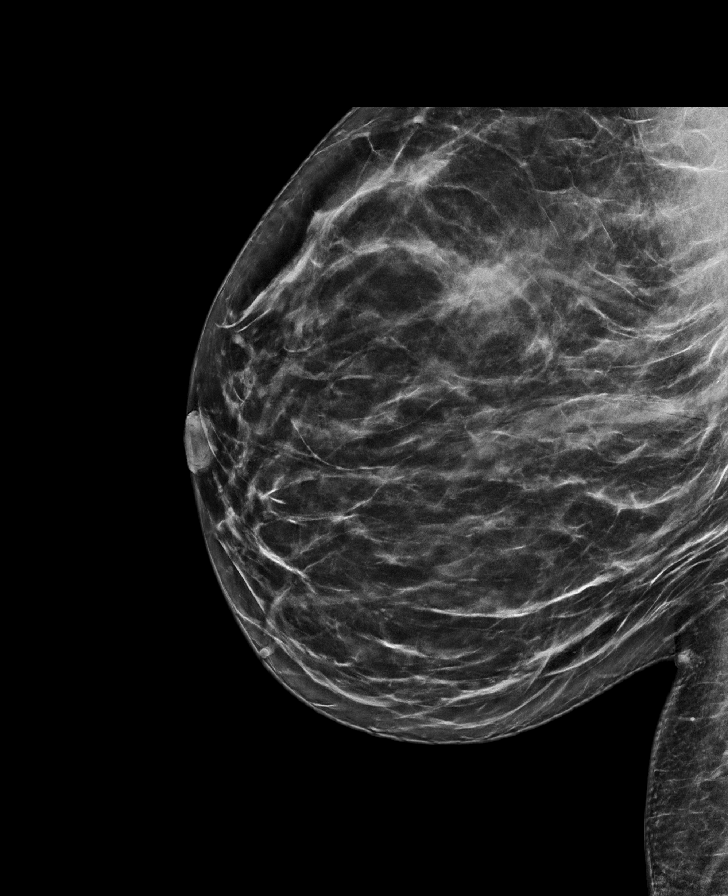

[R MLO tomo · tomo slice 37/74.0]
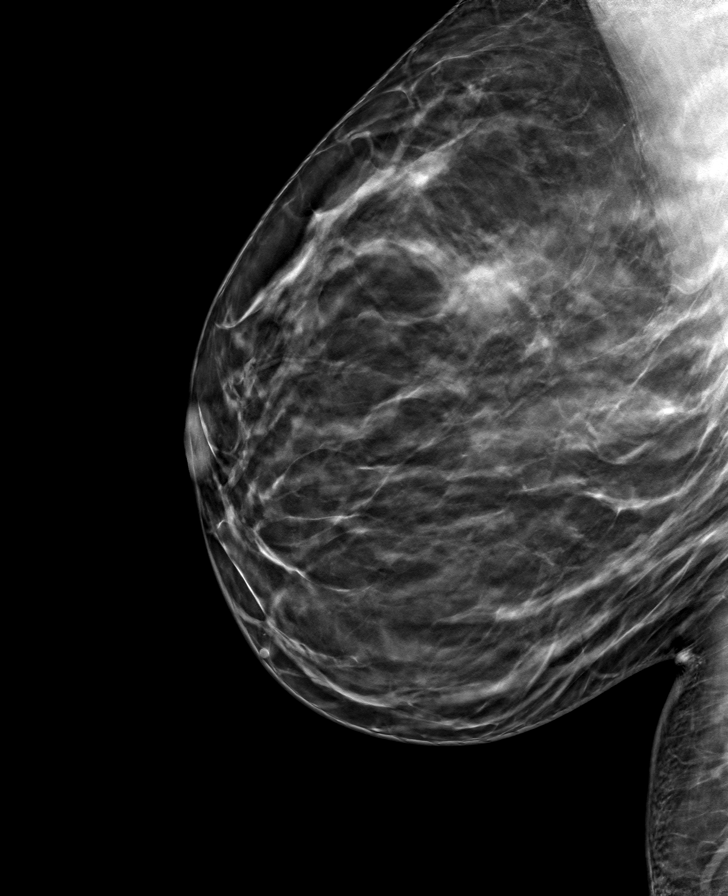

[R CC tomo · tomo slice 37/74.0]
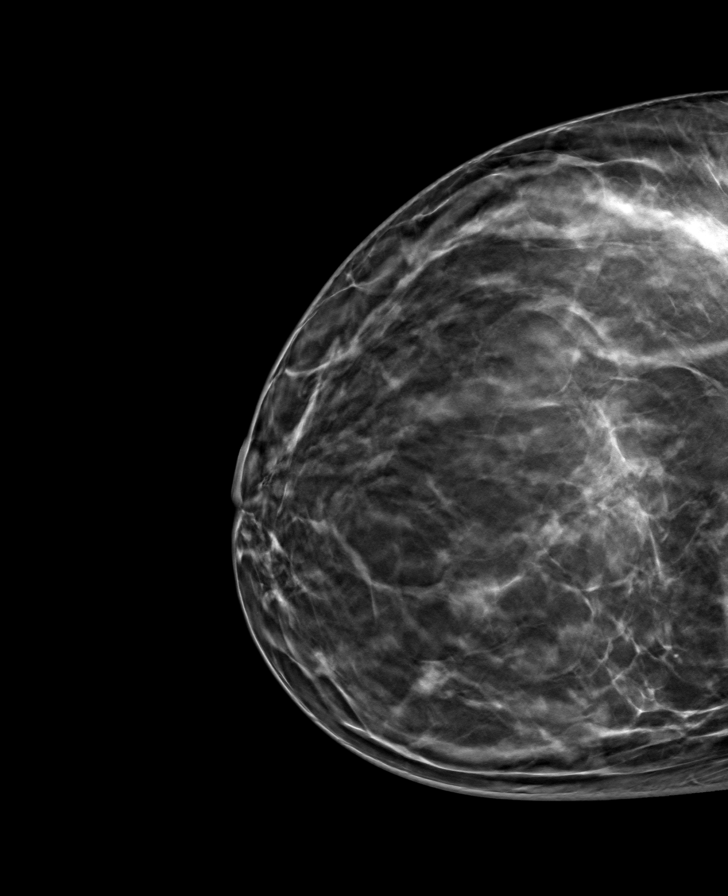

[L CC tomo · tomo slice 39/78.0]
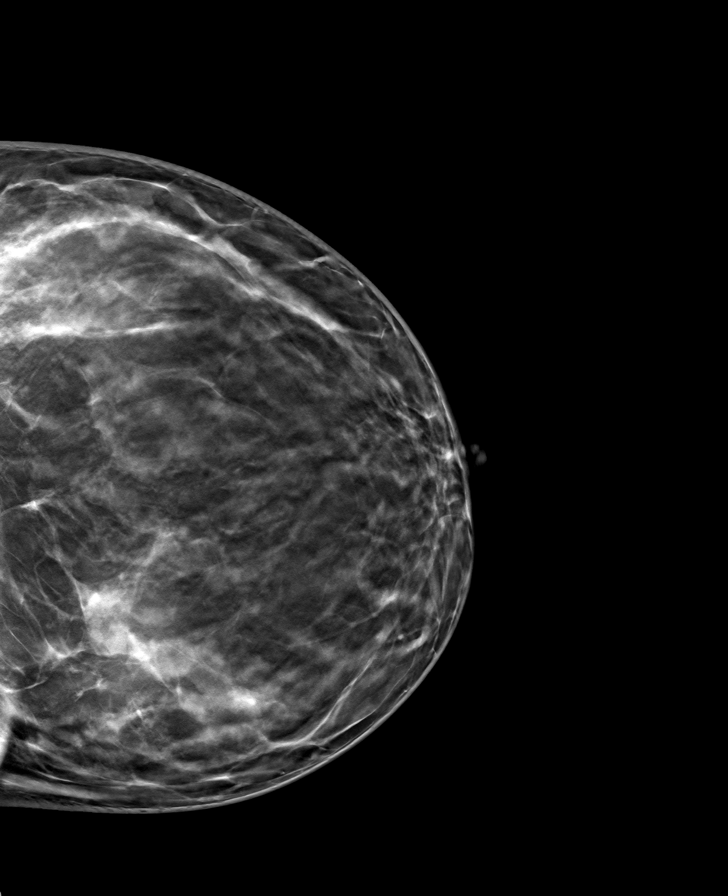

[L MLO tomo · tomo slice 41/80.0]
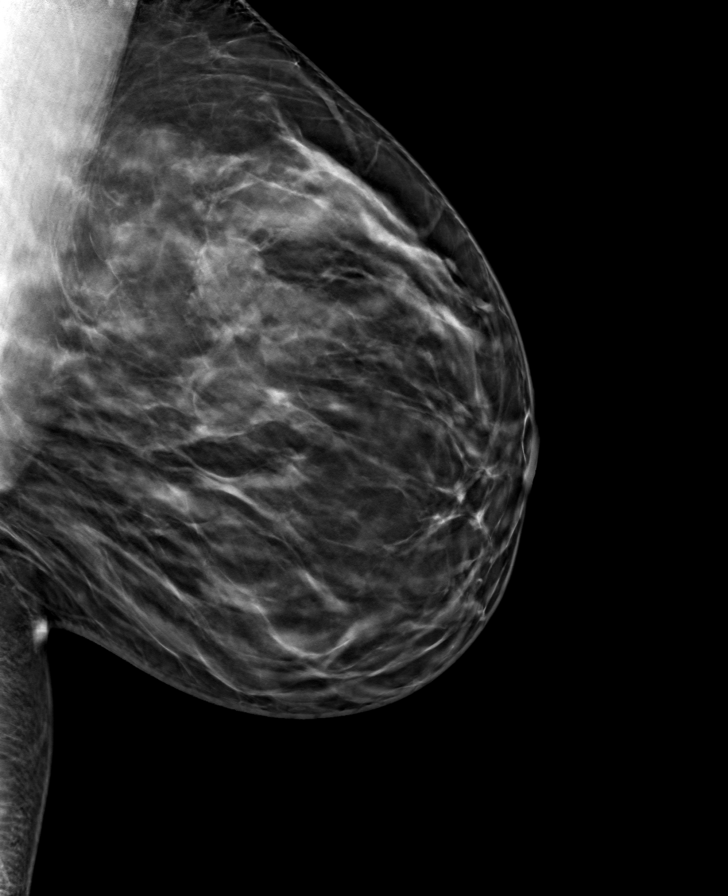

[8 of 24 positions shown; findings below may reference images not displayed]

ACR Breast Density Category b: There are scattered areas of
fibroglandular density.
FINDINGS: There are no findings suspicious for malignancy. The images were
evaluated with computer-aided detection.
IMPRESSION: No mammographic evidence of malignancy. A result letter of this
screening mammogram will be mailed directly to the patient.

RECOMMENDATION:
Screening mammogram at age 39. (Code:[G5])

BI-RADS CATEGORY  1: Negative.

## 2020-10-21 ENCOUNTER — Encounter: Payer: Self-pay | Admitting: Podiatry

## 2020-10-21 ENCOUNTER — Other Ambulatory Visit: Payer: Self-pay

## 2020-10-21 ENCOUNTER — Ambulatory Visit: Payer: 59 | Admitting: Podiatry

## 2020-10-21 DIAGNOSIS — M66872 Spontaneous rupture of other tendons, left ankle and foot: Secondary | ICD-10-CM | POA: Diagnosis not present

## 2020-10-21 DIAGNOSIS — M7671 Peroneal tendinitis, right leg: Secondary | ICD-10-CM | POA: Diagnosis not present

## 2020-10-21 MED ORDER — MELOXICAM 15 MG PO TABS: 15.0000 mg | ORAL_TABLET | Freq: Every day | ORAL | 3 refills | Status: DC

## 2020-10-21 NOTE — Patient Instructions (Signed)

## 2020-10-21 NOTE — Progress Notes (Signed)
  Subjective:  Patient ID: Erica Wyatt, female    DOB: [birth year],  MRN: [id number]  Chief Complaint  Patient presents with   Tendonitis     bilat foot pain     35 y.o. female presents with the above complaint. History confirmed with patient.  She has seen Dr. Al Corpus for that she has had surgical reconstruction of the posterior tibial tendon before as well as peroneal tendinitis.  MRI last year showed a slight tear in the peroneal tendon.  Was doing fairly well until recently when it began to worsen she was playing basketball with her son and at work.  Objective:  Physical Exam: warm, good capillary refill, no trophic changes or ulcerative lesions, normal DP and PT pulses, and normal sensory exam. Left Foot: Mild pain over insertion PT tendon Right Foot: Pain of the peroneal tendons in the retromalleolar groove, no gross instability or dislocation or subluxation   Assessment:   1. Peroneal tendinitis of right lower extremity   2. Nontraumatic tear of tibialis posterior tendon, left      Plan:  Patient was evaluated and treated and all questions answered.  Discussed the etiology and treatment options for peroneal tendinitis including stretching, formal physical therapy with an eccentric exercises therapy plan, supportive shoegears such as a running shoe or sneaker, heel lifts, topical and oral medications.  We also discussed that I do not routinely perform injections in this area because of the risk of an increased damage or rupture of the tendon.  We also discussed the role of surgical treatment of this for patients who do not improve after exhausting non-surgical treatment options.  -He is possible the tear has worsened.  New MRI is ordered for evaluation for possible surgical intervention or nonsurgical treatment pending results -Rx for Meloxicam. Advised on risks, benefits, and alternatives of the medication -She will follow-up with Dr. Al Corpus after MRI   No follow-ups on file.

## 2020-10-27 ENCOUNTER — Ambulatory Visit
Admission: RE | Admit: 2020-10-27 | Discharge: 2020-10-27 | Disposition: A | Discharge status: Home / Self Care | Payer: 59 | Source: Ambulatory Visit | Attending: Podiatry | Admitting: Podiatry

## 2020-10-27 DIAGNOSIS — M66872 Spontaneous rupture of other tendons, left ankle and foot: Secondary | ICD-10-CM

## 2020-10-27 DIAGNOSIS — M7671 Peroneal tendinitis, right leg: Secondary | ICD-10-CM

## 2020-10-27 IMAGING — MR MR ANKLE*R* W/O CM
6 series · 40 of 40 positions shown · non-contrast
Comparison: MRI [DATE].  X-ray [DATE]

CLINICAL DATA: Diffuse right ankle pain for 8 weeks. Ankle injury 2
years ago

EXAM:
MRI OF THE RIGHT ANKLE WITHOUT CONTRAST
TECHNIQUE: Multiplanar, multisequence MR imaging of the ankle was performed. No
intravenous contrast was administered.

[Series 4: T2 fat-sat · axial · 3.0mm · 0.50mm/px · z∈[-104,+24]mm · 7 of 34 slices shown (1 of 2)]
[im 1/34]
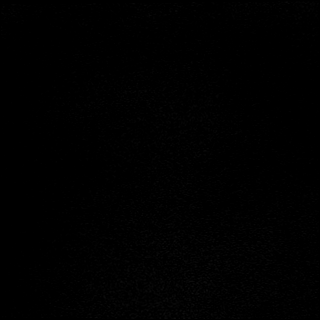
[im 6/34]
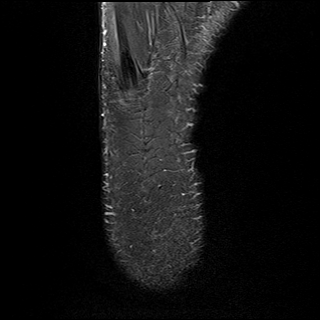
[im 12/34]
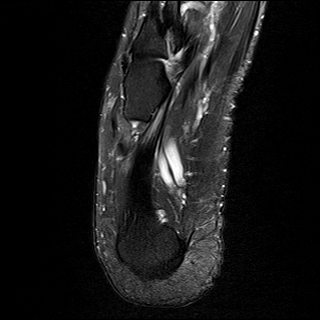
[im 17/34]
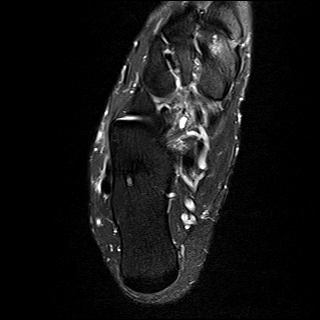
[im 23/34]
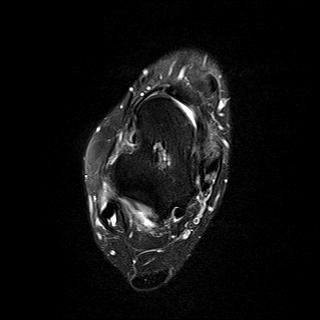
[im 28/34]
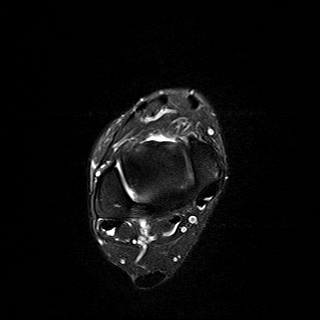
[im 34/34]
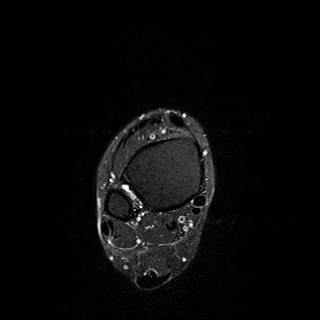

[Series 5: PD fat-sat · axial · 3.0mm · 0.50mm/px · z∈[-104,+24]mm · 7 of 34 slices shown]
[im 1/34]
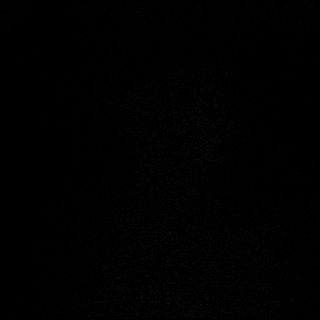
[im 6/34]
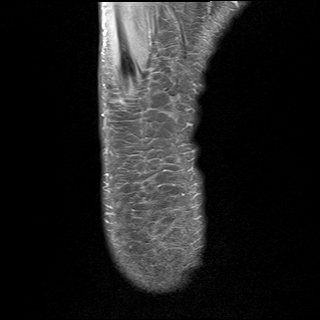
[im 12/34]
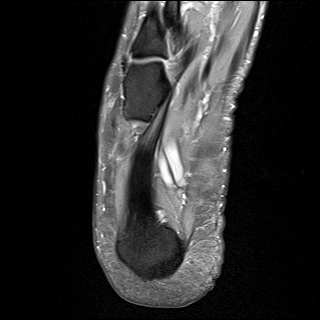
[im 17/34]
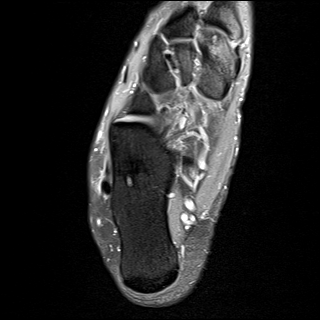
[im 23/34]
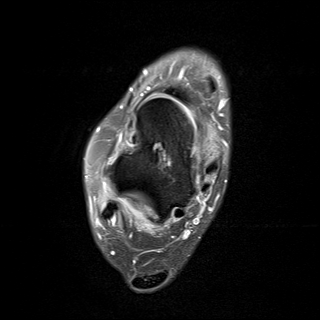
[im 28/34]
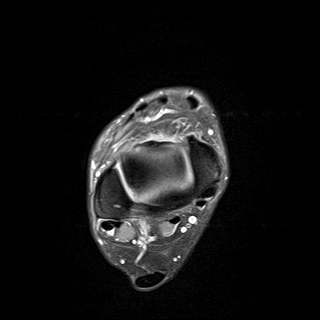
[im 34/34]
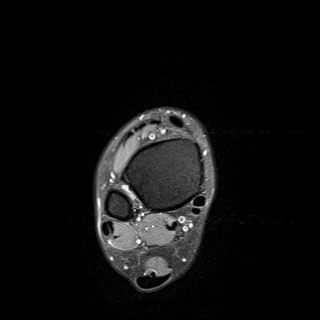

[Series 6: T1 · sagittal · 4.0mm · 0.56mm/px · 5 of 22 slices shown (1 of 2)]
[im 1/22]
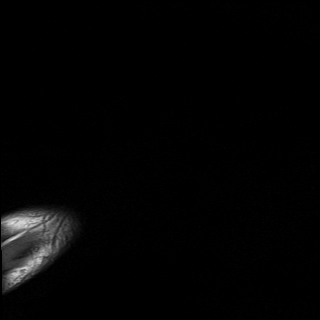
[im 6/22]
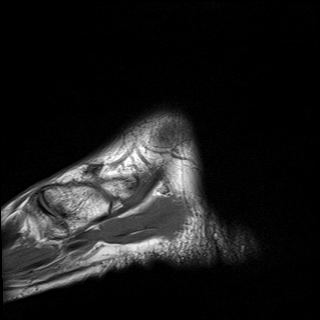
[im 11/22]
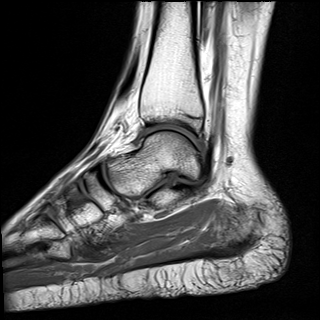
[im 16/22]
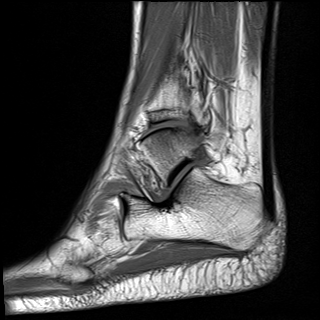
[im 22/22]
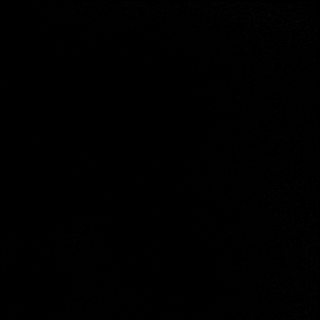

[Series 7: STIR · sagittal · 4.0mm · 0.35mm/px · 5 of 22 slices shown]
[im 1/22]
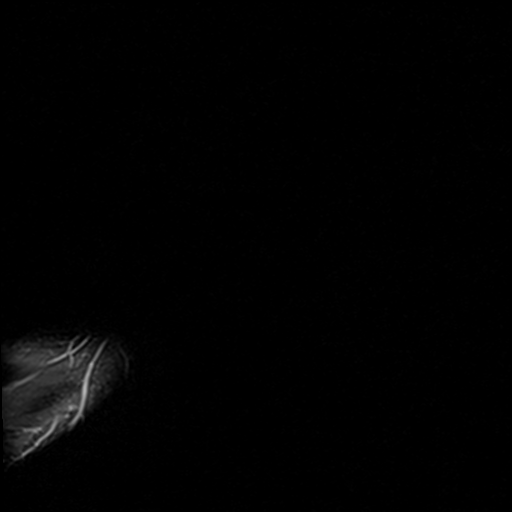
[im 6/22]
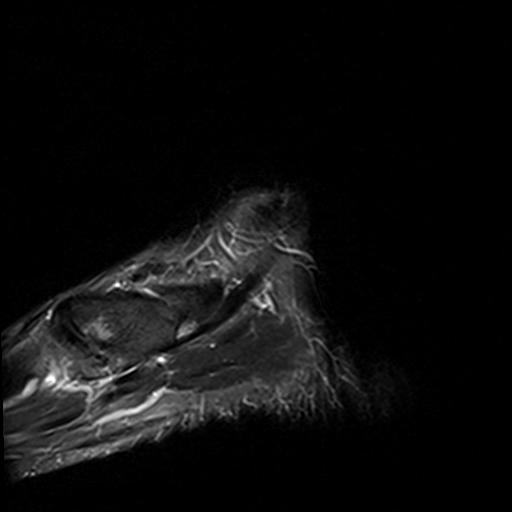
[im 11/22]
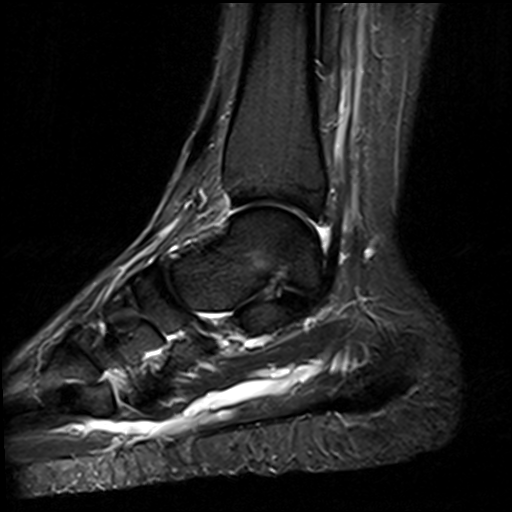
[im 16/22]
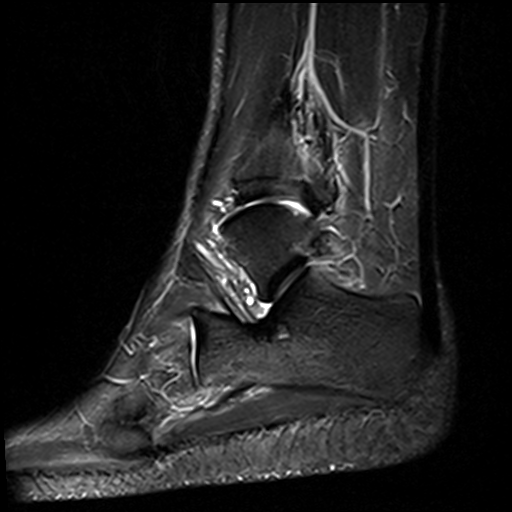
[im 22/22]
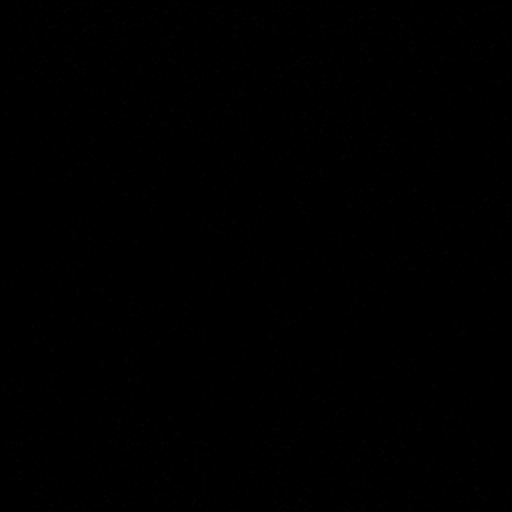

[Series 8: T2 fat-sat · coronal · 3.0mm · 0.50mm/px · 9 of 40 slices shown (2 of 2)]
[im 1/40]
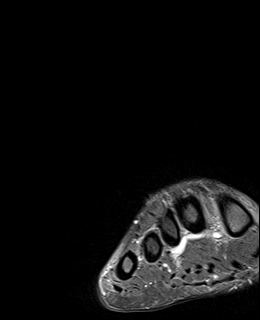
[im 5/40]
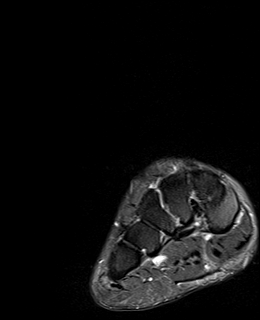
[im 10/40]
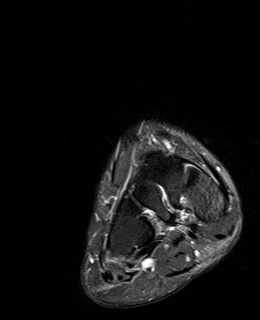
[im 15/40]
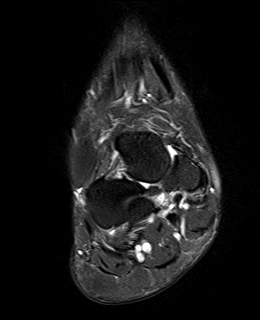
[im 20/40]
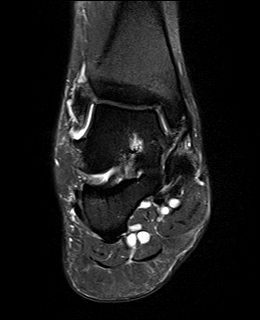
[im 25/40]
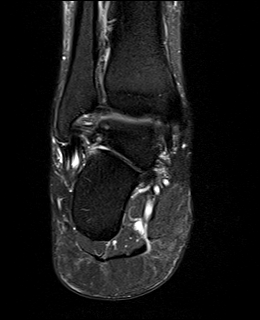
[im 30/40]
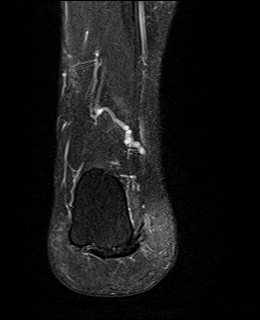
[im 35/40]
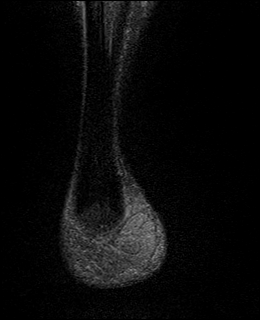
[im 40/40]
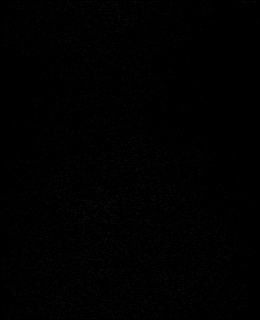

[Series 9: T1 · axial · 3.0mm · 0.50mm/px · z∈[-104,+24]mm · 7 of 34 slices shown (2 of 2)]
[im 1/34]
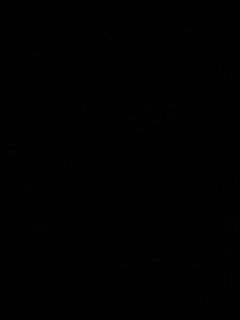
[im 6/34]
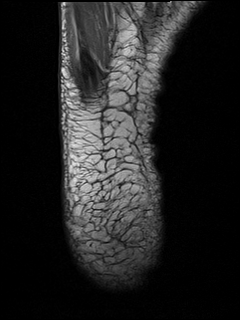
[im 12/34]
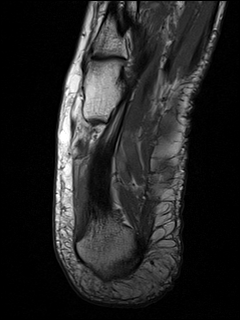
[im 17/34]
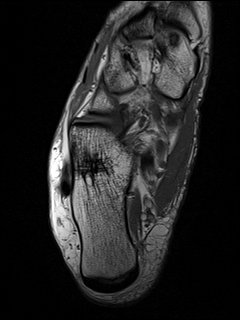
[im 23/34]
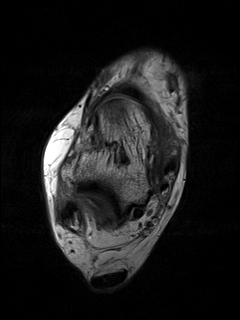
[im 28/34]
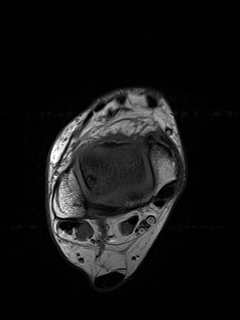
[im 34/34]
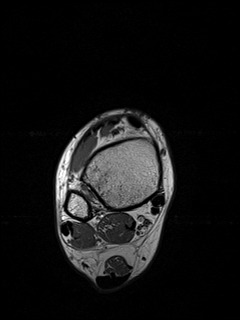

[40 of 40 positions shown; findings below may reference images not displayed]

FINDINGS: TENDONS

Peroneal: Mild tendinosis and tenosynovitis of the peroneus longus
tendon without tear. Intact peroneus brevis.

Posteromedial: Mild tibialis posterior tendinosis distally. Flexor
hallucis longus and flexor digitorum longus tendons intact. No
tenosynovitis.

Anterior: Intact tibialis anterior, extensor hallucis longus and
extensor digitorum longus tendons.

Achilles: Intact.

Plantar Fascia: Intact.

LIGAMENTS

Lateral: Chronic complete tear of the anterior talofibular ligament.
Irregular, attenuated appearance of the calcaneofibular ligament is
unchanged from prior, likely sequela of remote trauma. Intact
posterior talofibular ligament. The anterior and posterior
tibiofibular ligaments are intact.

Medial: Chronic low-grade partial tear of the deltoid ligament.
Spring ligament complex intact.

CARTILAGE

Ankle Joint: Mild chondral thinning without focal defect or
osteochondral lesion. Mild tibiotalar osteophytosis. Trace joint
effusion.

Subtalar Joints/Sinus Tarsi: No joint effusion or chondral defect.
Soft tissue edema with a few tiny ganglion cysts in the sinus tarsi.

Bones: Mild first TMT joint osteoarthritis with subchondral cystic
change in the medial cuneiform. No acute fracture. No malalignment.
No suspicious bone lesion.

Other: None.
IMPRESSION: 1. Mild tendinosis and tenosynovitis of the peroneus longus tendon
without tear.
2. Mild tibialis posterior tendinosis distally.
3. Chronic complete ATFL tear.
4. Soft tissue edema with a few tiny ganglion cysts in the sinus
tarsi. Correlate for sinus tarsi syndrome.
5. Mild tibiotalar and first TMT joint osteoarthritis.

## 2020-10-30 ENCOUNTER — Telehealth: Payer: Self-pay | Admitting: Podiatry

## 2020-10-30 NOTE — Telephone Encounter (Signed)
Patient called for her MRI results.

## 2020-10-31 ENCOUNTER — Telehealth: Payer: Self-pay | Admitting: Podiatry

## 2020-10-31 NOTE — Telephone Encounter (Signed)
Patient called wanting to know the results of her MRI. 

## 2020-11-12 ENCOUNTER — Ambulatory Visit: Payer: 59 | Admitting: Podiatry

## 2022-04-03 ENCOUNTER — Ambulatory Visit (INDEPENDENT_AMBULATORY_CARE_PROVIDER_SITE_OTHER): Payer: 59

## 2022-04-03 ENCOUNTER — Ambulatory Visit: Payer: 59 | Admitting: Podiatry

## 2022-04-03 DIAGNOSIS — M76829 Posterior tibial tendinitis, unspecified leg: Secondary | ICD-10-CM

## 2022-04-03 DIAGNOSIS — M25571 Pain in right ankle and joints of right foot: Secondary | ICD-10-CM | POA: Diagnosis not present

## 2022-04-03 NOTE — Patient Instructions (Signed)
For inserts I like POWERSTEPS, SUPERFEET, [employer]  Posterior Tibial Tendon Tear Rehab Ask your health care provider which exercises are safe for you. Do exercises exactly as told by your health care provider and adjust them as directed. It is normal to feel mild stretching, pulling, tightness, or discomfort as you do these exercises. Stop right away if you feel sudden pain or your pain gets worse. Do not begin these exercises until told by your health care provider. Stretching and range-of-motion exercises These exercises warm up your muscles and joints and improve the movement and flexibility of your ankle. These exercises also help to relieve pain, numbness, and tingling. Gastroc stretch  Sit on the floor with your left / right leg extended. Loop a belt or towel around ball of your left / right foot. The ball of your foot is on the walking surface, right under your toes. Keep your left / right ankle and foot relaxed and keep your knee straight while you use the belt or towel to pull your foot and ankle toward you. You should feel a gentle stretch behind your calf or knee (gastrocnemius). Hold this position for __________ seconds. Repeat __________ times. Complete this exercise __________ times a day. Active ankle dorsiflexion and plantar flexion  Sit with your left / right knee straight or bent. Do not rest your foot on anything. Flex your left / right ankle to tilt the top of your foot toward your shin (dorsiflexion). Hold this position for __________ seconds. Point your toes downward to tilt the top of your foot away from your shin (plantar flexion). Hold this position for __________ seconds. Repeat __________ times with your knee straight and __________ times with your knee bent. Complete this exercise __________ times a day. Passive ankle plantar flexion  Sit with your left / right leg crossed over your opposite knee. With your left / right hand, pull the front of your foot and toes  toward you (plantar flexion). You should feel a gentle stretch on the top of your foot and ankle. Hold this position for __________ seconds. Repeat __________ times. Complete this exercise __________ times a day. Passive ankle eversion  Sit with your left / right ankle crossed over your opposite knee. With your left / right hand, hold your foot so that your thumb is on the top of your foot and your fingers are on the bottom of your foot. Gently push and twist your ankle downward (eversion) so the smallest toes rise slightly toward the ceiling. You should feel a gentle stretch on the inside of your ankle. Hold this stretch for __________ seconds. Repeat __________ times. Complete this exercise __________ times a day. Passive ankle inversion  Sit with your left / right ankle crossed over your opposite knee. With your left / right hand, hold your foot so that your thumb is on the bottom of your foot and your fingers are across the top of your foot. Gently pull and twist your foot so the smallest toe comes toward you (inversion). You should feel a gentle stretch on the outside of your ankle. Hold the stretch for __________ seconds. Repeat __________ times. Complete this exercise __________ times a day. Ankle alphabet  Sit with your left / right leg supported at the lower leg. Do not rest your foot on anything. Make sure your foot has room to move freely. Think of your left / right foot as a paintbrush, and move your foot to trace each letter of the alphabet in the air.  Keep your hip and knee still while you trace. Trace every letter of the alphabet. Repeat __________ times. Complete this exercise __________ times a day. Strengthening exercises These exercises build strength and endurance in your lower leg. Endurance is the ability to use your muscles for a long time, even after they get tired. Dorsiflexion  Secure a rubber exercise band or tube to an object that will not move if it is pulled  on, such as a table leg. Secure the other end of the band around your left / right foot. Sit on the floor, facing the object with your left / right leg extended. The band or tube should be slightly tense when your foot is relaxed. Slowly flex your left / right ankle and toes to bring your foot toward you (dorsiflexion). Hold this position for __________ seconds. Let the band or tube slowly pull your foot back to the starting position. Repeat __________ times. Complete this exercise __________ times a day. Plantar flexion while seated  Sit on the floor with your left / right leg extended. Loop a rubber exercise band or tube around the ball of your left / right foot. The ball of your foot is on the walking surface, right under your toes. The band or tube should be slightly tense when your foot is relaxed. Slowly point your toes downward, pushing them away from you (plantar flexion). Hold this position for __________ seconds. Let the band or tube slowly pull your foot back to the starting position. Repeat __________ times. Complete this exercise __________ times a day. Towel curls  Sit in a chair on a non-carpeted surface, and put your feet on the floor. Place a towel in front of your feet. If told by your health care provider, add __________ to the end of the towel. Keeping your heel on the floor, put your left / right foot on the towel. Pull the towel toward you by grabbing the towel with your toes and curling them under. Keep your heel on the floor. Repeat __________ times. Complete this exercise __________ times a day. This information is not intended to replace advice given to you by your health care provider. Make sure you discuss any questions you have with your health care provider. Document Revised: 07/22/2018 Document Reviewed: 05/18/2018 Elsevier Patient Education  2023 [employer].

## 2022-04-09 NOTE — Progress Notes (Signed)
Subjective:   Patient ID: Erica Wyatt, female   DOB: 36 y.o.   MRN: [id number]   HPI Chief Complaint  Patient presents with   Foot Pain    Right foot pain , patient states this has been on going for awhile. Patient states foot is painful when pressure is applied.   36 year old female presents the office with above concerns.  She states that she still gets pain on the inside aspect of the right ankle, foot after activity.  Most worse with pressure.  No injuries.  She states this been ongoing for some time and she was seen by the office previously for the same issue.  No recent injuries.   Review of Systems  All other systems reviewed and are negative.  Past Medical History:  Diagnosis Date   Asthma    exercise inducted   GERD (gastroesophageal reflux disease)    Migraine headache    Migraines    Thrombocytopenia complicating pregnancy (HCC)     Past Surgical History:  Procedure Laterality Date   WISDOM TOOTH EXTRACTION       Current Outpatient Medications:    albuterol (PROVENTIL HFA) 108 (90 BASE) MCG/ACT inhaler, Inhale 2 puffs into the lungs every 6 (six) hours as needed. Asthma attacks, Disp: 1 Inhaler, Rfl: 0   amitriptyline (ELAVIL) 25 MG tablet, Take 25 mg by mouth at bedtime., Disp: , Rfl:    amphetamine-dextroamphetamine (ADDERALL) 10 MG tablet, dextroamphetamine-amphetamine 10 mg tablet  TAKE 1 TABLET BY MOUTH TWICE A DAY, Disp: , Rfl:    cetirizine (ZYRTEC) 10 MG tablet, Take 1 tablet by mouth daily., Disp: , Rfl:    CONCERTA 54 MG CR tablet, Take 54 mg by mouth daily., Disp: , Rfl:    etonogestrel (NEXPLANON) 68 MG IMPL implant, 1 each by Subdermal route once., Disp: , Rfl:    fluticasone (FLONASE) 50 MCG/ACT nasal spray, U 1 SPR IEN D, Disp: , Rfl: 0   ibuprofen (ADVIL,MOTRIN) 800 MG tablet, Take 1 tablet (800 mg total) by mouth every 8 (eight) hours as needed., Disp: 30 tablet, Rfl: 5   meloxicam (MOBIC) 15 MG tablet, Take 1 tablet (15 mg total) by mouth  daily., Disp: 30 tablet, Rfl: 3   methylphenidate (CONCERTA) 36 MG PO CR tablet, Concerta 36 mg tablet,extended release  TAKE 1 TABLET BY MOUTH EVERY DAY, Disp: , Rfl:    methylphenidate 36 MG PO CR tablet, Take 36 mg by mouth daily., Disp: , Rfl:    metroNIDAZOLE (FLAGYL) 500 MG tablet, Take 500 mg by mouth 2 (two) times daily., Disp: , Rfl:    tinidazole (TINDAMAX) 500 MG tablet, Take 1 tablet (500 mg total) by mouth 2 (two) times daily., Disp: 14 tablet, Rfl: 2   UBRELVY 100 MG TABS, Take by mouth., Disp: , Rfl:   Allergies  Allergen Reactions   Amoxicillin     REACTION: childhood reaction,swelling   Latex Other (See Comments)    Irritation to mucous membranes, especially vaginal tissue   Penicillins Other (See Comments)    REACTION: childhood reaction,swelling Unknown; from childhood   Topamax [Topiramate]     Suicidal thoughts   Sulfa Antibiotics Swelling and Rash          Objective:  Physical Exam  General: AAO x3, NAD  Dermatological: Skin is warm, dry and supple bilateral.  There are no open sores, no preulcerative lesions, no rash or signs of infection present.  Vascular: Dorsalis Pedis artery and Posterior Tibial artery pedal pulses  are 2/4 bilateral with immedate capillary fill time.  There is no pain with calf compression, swelling, warmth, erythema.   Neruologic: Grossly intact via light touch bilateral.  Negative Tinel sign.  Musculoskeletal: No joint tenderness is localized in the course of the posterior tibial, flexor tendons.  There is no extremity there is no erythema.  Clinically the tendons appear to be intact.  Decreased medial arch height.  Flexor, extensor tendons intact.  MMT 5/5.  Gait: Unassisted, Nonantalgic.       Assessment:   Posterior tibial tendon dysfunction     Plan:  -Treatment options discussed including all alternatives, risks, and complications -Etiology of symptoms were discussed -We discussed both conservative as well as  surgical treatment options.  Also discussed repeating MRI but ultimately both decided to hold off on this.  We discussed anti-inflammatories, icing to help with inflammation we also discussed different rehab exercises to help specifically with the posterior tibial tendon.  Long-term we discussed using good arch supports.  She send start with over-the-counter inserts but if needed we can can do custom inserts as well.  Vivi Barrack DPM

## 2022-04-14 ENCOUNTER — Ambulatory Visit: Payer: 59 | Admitting: Student

## 2022-04-14 ENCOUNTER — Ambulatory Visit: Payer: 59 | Admitting: Obstetrics

## 2022-05-25 ENCOUNTER — Encounter: Payer: Self-pay | Admitting: Obstetrics and Gynecology

## 2022-05-25 ENCOUNTER — Other Ambulatory Visit: Payer: Self-pay | Admitting: Obstetrics and Gynecology

## 2022-05-25 ENCOUNTER — Ambulatory Visit (INDEPENDENT_AMBULATORY_CARE_PROVIDER_SITE_OTHER): Payer: 59 | Admitting: Obstetrics and Gynecology

## 2022-05-25 ENCOUNTER — Other Ambulatory Visit (HOSPITAL_COMMUNITY)
Admission: RE | Admit: 2022-05-25 | Discharge: 2022-05-25 | Disposition: A | Discharge status: Home / Self Care | Payer: 59 | Source: Ambulatory Visit | Attending: Obstetrics | Admitting: Obstetrics

## 2022-05-25 VITALS — BP 103/71 | HR 83 | Ht 76.0 in | Wt 258.0 lb

## 2022-05-25 DIAGNOSIS — Z01419 Encounter for gynecological examination (general) (routine) without abnormal findings: Secondary | ICD-10-CM | POA: Diagnosis not present

## 2022-05-25 DIAGNOSIS — Z3046 Encounter for surveillance of implantable subdermal contraceptive: Secondary | ICD-10-CM | POA: Diagnosis not present

## 2022-05-25 MED ORDER — PHEXXI 1.8-1-0.4 % VA GEL: VAGINAL | 5 refills | Status: DC

## 2022-05-25 NOTE — Progress Notes (Signed)
Subjective:     Erica Wyatt is a 37 y.o. female P1 with LMP 1/15/20204 and BMI 31 who is here for a comprehensive physical exam. The patient reports no problems. She started having a period again December. Patient has had Nexplanon in place since 2020 and is due for removal. She plans to use condoms for contraception. She is without any other complaints. She denies pelvic pain and abnormal discharge. She denies dyspareunia, urinary symptoms or chronic constipation  Past Medical History:  Diagnosis Date   Asthma    exercise inducted   GERD (gastroesophageal reflux disease)    Migraine headache    Migraines    Thrombocytopenia complicating pregnancy ([place])    Past Surgical History:  Procedure Laterality Date   WISDOM TOOTH EXTRACTION     Family History  Problem Relation Age of Onset   Heart disease Maternal Grandmother    Fibroids Maternal Grandmother    Heart disease Maternal Grandfather    Cancer Paternal Grandmother    Heart disease Paternal Grandfather    Hypertension Mother    Fibroids Mother    Multiple sclerosis Mother    Breast cancer Mother    Hypertension Father    Anesthesia problems Neg Hx    Hypotension Neg Hx    Malignant hyperthermia Neg Hx    Pseudochol deficiency Neg Hx     Social History   Socioeconomic History   Marital status: Single    Spouse name: Not on file   Number of children: Not on file   Years of education: Not on file   Highest education level: Not on file  Occupational History   Occupation: Chemical engineer: UNEMPLOYED  Tobacco Use   Smoking status: Never   Smokeless tobacco: Never  Vaping Use   Vaping Use: Never used  Substance and Sexual Activity   Alcohol use: Yes    Alcohol/week: 0.0 standard drinks of alcohol    Comment: socially    Drug use: No   Sexual activity: Yes    Partners: Male    Birth control/protection: Condom, Implant  Other Topics Concern   Not on file  Social History Narrative   Not on file    Social Determinants of Health   Financial Resource Strain: Not on file  Food Insecurity: Not on file  Transportation Needs: Not on file  Physical Activity: Not on file  Stress: Not on file  Social Connections: Not on file  Intimate Partner Violence: Not on file   Health Maintenance  Topic Date Due   COVID-19 Vaccine (1) Never done   MAMMOGRAM  08/31/2021   INFLUENZA VACCINE  11/11/2021   DTaP/Tdap/Td (2 - Td or Tdap) 12/15/2021   PAP SMEAR-Modifier  08/13/2023   Hepatitis C Screening  Completed   HIV Screening  Completed   HPV VACCINES  Aged Out       Review of Systems Pertinent items noted in HPI and remainder of comprehensive ROS otherwise negative.   Objective:  Blood pressure 103/71, pulse 83, height 6' 4"$  (1.93 m), weight 258 lb (117 kg), last menstrual period 04/27/2022.   GENERAL: Well-developed, well-nourished female in no acute distress.  HEENT: Normocephalic, atraumatic. Sclerae anicteric.  NECK: Supple. Normal thyroid.  LUNGS: Clear to auscultation bilaterally.  HEART: Regular rate and rhythm. BREASTS: Symmetric in size. No palpable masses or lymphadenopathy, skin changes, or nipple drainage. ABDOMEN: Soft, nontender, nondistended. No organomegaly. PELVIC: Normal external female genitalia. Vagina is pink and rugated.  Normal discharge.  Normal appearing cervix. Uterus is normal in size. No adnexal mass or tenderness. Chaperone present during the pelvic exam EXTREMITIES: No cyanosis, clubbing, or edema, 2+ distal pulses.     Assessment:    Healthy female exam.      Plan:    Pap smear collected STI screening collected. Patient declined blood work Patient will be contacted with abnormal results Patient is requesting Nexplanon removal  PROCEDURE NOTE Patient given informed consent, signed copy in the chart, time out was performed.   Removal Patient given informed consent for removal of her Nexplanon, time out was performed.  Signed copy in the chart.   Appropriate time out taken. Implanon site identified.  Area prepped in usual sterile fashon. One cc of 1% lidocaine was used to anesthetize the area at the distal end of the implant. A small stab incision was made right beside the implant on the distal portion.  The Nexplanon rod was grasped using hemostats and removed without difficulty.  There was less than 3 cc blood loss. There were no complications.  A small amount of antibiotic ointment and steri-strips were applied over the small incision.  A pressure bandage was applied to reduce any bruising.  The patient tolerated the procedure well and was given post procedure instructions.  See After Visit Summary for Counseling Recommendations

## 2022-05-26 LAB — CERVICOVAGINAL ANCILLARY ONLY
Chlamydia: NEGATIVE
Comment: NEGATIVE
Comment: NORMAL
Neisseria Gonorrhea: NEGATIVE

## 2022-05-27 LAB — CYTOLOGY - PAP
Comment: NEGATIVE
Diagnosis: NEGATIVE
High risk HPV: NEGATIVE

## 2022-08-19 ENCOUNTER — Emergency Department (HOSPITAL_BASED_OUTPATIENT_CLINIC_OR_DEPARTMENT_OTHER)
Admission: EM | Admit: 2022-08-19 | Discharge: 2022-08-19 | Disposition: A | Discharge status: Home / Self Care | Payer: 59 | Attending: Emergency Medicine | Admitting: Emergency Medicine

## 2022-08-19 ENCOUNTER — Emergency Department (HOSPITAL_BASED_OUTPATIENT_CLINIC_OR_DEPARTMENT_OTHER): Payer: 59

## 2022-08-19 ENCOUNTER — Other Ambulatory Visit: Payer: Self-pay

## 2022-08-19 ENCOUNTER — Encounter (HOSPITAL_BASED_OUTPATIENT_CLINIC_OR_DEPARTMENT_OTHER): Payer: Self-pay | Admitting: Emergency Medicine

## 2022-08-19 DIAGNOSIS — R109 Unspecified abdominal pain: Secondary | ICD-10-CM | POA: Diagnosis present

## 2022-08-19 DIAGNOSIS — R1011 Right upper quadrant pain: Secondary | ICD-10-CM | POA: Insufficient documentation

## 2022-08-19 DIAGNOSIS — Z7951 Long term (current) use of inhaled steroids: Secondary | ICD-10-CM | POA: Insufficient documentation

## 2022-08-19 DIAGNOSIS — J45909 Unspecified asthma, uncomplicated: Secondary | ICD-10-CM | POA: Insufficient documentation

## 2022-08-19 DIAGNOSIS — R1031 Right lower quadrant pain: Secondary | ICD-10-CM | POA: Diagnosis not present

## 2022-08-19 LAB — CBC WITH DIFFERENTIAL/PLATELET
Abs Immature Granulocytes: 0.01 10*3/uL (ref 0.00–0.07)
Basophils Absolute: 0 10*3/uL (ref 0.0–0.1)
Basophils Relative: 1 %
Eosinophils Absolute: 0.3 10*3/uL (ref 0.0–0.5)
Eosinophils Relative: 8 %
HCT: 40.3 % (ref 36.0–46.0)
Hemoglobin: 13.4 g/dL (ref 12.0–15.0)
Immature Granulocytes: 0 %
Lymphocytes Relative: 36 %
Lymphs Abs: 1.5 10*3/uL (ref 0.7–4.0)
MCH: 29.6 pg (ref 26.0–34.0)
MCHC: 33.3 g/dL (ref 30.0–36.0)
MCV: 89 fL (ref 80.0–100.0)
Monocytes Absolute: 0.5 10*3/uL (ref 0.1–1.0)
Monocytes Relative: 11 %
Neutro Abs: 1.8 10*3/uL (ref 1.7–7.7)
Neutrophils Relative %: 44 %
Platelets: 153 10*3/uL (ref 150–400)
RBC: 4.53 MIL/uL (ref 3.87–5.11)
RDW: 12.1 % (ref 11.5–15.5)
WBC: 4.1 10*3/uL (ref 4.0–10.5)
nRBC: 0 % (ref 0.0–0.2)

## 2022-08-19 LAB — COMPREHENSIVE METABOLIC PANEL
ALT: 15 U/L (ref 0–44)
AST: 18 U/L (ref 15–41)
Albumin: 3.7 g/dL (ref 3.5–5.0)
Alkaline Phosphatase: 32 U/L — ABNORMAL LOW (ref 38–126)
Anion gap: 6 (ref 5–15)
BUN: 13 mg/dL (ref 6–20)
CO2: 26 mmol/L (ref 22–32)
Calcium: 8.9 mg/dL (ref 8.9–10.3)
Chloride: 106 mmol/L (ref 98–111)
Creatinine, Ser: 1.21 mg/dL — ABNORMAL HIGH (ref 0.44–1.00)
GFR, Estimated: 59 mL/min — ABNORMAL LOW (ref >60)
Glucose, Bld: 85 mg/dL (ref 70–99)
Potassium: 3.9 mmol/L (ref 3.5–5.1)
Sodium: 138 mmol/L (ref 135–145)
Total Bilirubin: 0.7 mg/dL (ref 0.3–1.2)
Total Protein: 7 g/dL (ref 6.5–8.1)

## 2022-08-19 LAB — URINALYSIS, ROUTINE W REFLEX MICROSCOPIC
Bilirubin Urine: NEGATIVE
Glucose, UA: NEGATIVE mg/dL
Hgb urine dipstick: NEGATIVE
Ketones, ur: NEGATIVE mg/dL
Leukocytes,Ua: NEGATIVE
Nitrite: NEGATIVE
Protein, ur: NEGATIVE mg/dL
Specific Gravity, Urine: 1.025 (ref 1.005–1.030)
pH: 6.5 (ref 5.0–8.0)

## 2022-08-19 LAB — LIPASE, BLOOD: Lipase: 40 U/L (ref 11–51)

## 2022-08-19 LAB — PREGNANCY, URINE: Preg Test, Ur: NEGATIVE

## 2022-08-19 MED ORDER — IOHEXOL 300 MG/ML  SOLN
100.0000 mL | Freq: Once | INTRAMUSCULAR | Status: AC | PRN
  Administered 2022-08-19: 100 mL via INTRAVENOUS

## 2022-08-19 MED ORDER — KETOROLAC TROMETHAMINE 15 MG/ML IJ SOLN
15.0000 mg | Freq: Once | INTRAMUSCULAR | Status: AC
  Administered 2022-08-19: 15 mg via INTRAVENOUS
  Filled 2022-08-19: qty 1

## 2022-08-19 MED ORDER — OXYCODONE-ACETAMINOPHEN 5-325 MG PO TABS
1.0000 | ORAL_TABLET | Freq: Once | ORAL | Status: AC
  Administered 2022-08-19: 1 via ORAL
  Filled 2022-08-19: qty 1

## 2022-08-19 NOTE — ED Provider Notes (Signed)
[place] [facility]ERG[facility] DEPARTMENT AT [facility] Provider Note  CSN: [id number] Arrival date & time: 08/19/22 0945  Chief Complaint(s) Back Pain and Abdominal Pain  HPI Erica Wyatt is a 37 y.o. female with history of migraine, asthma presenting to the emergency department with right flank pain.  She reports right flank pain for around 1 and half weeks.  Reports the pain comes and goes.  No nausea or vomiting, fevers or chills.  No urinary symptoms, discharge.  Has tried stretching without relief.  Denies similar episode in the past.  She also reports some dyspareunia however reports that this is intermittent.   Past Medical History Past Medical History:  Diagnosis Date   Asthma    exercise inducted   GERD (gastroesophageal reflux disease)    Migraine headache    Migraines    Thrombocytopenia complicating pregnancy [facility])    Patient Active Problem List   Diagnosis Date Noted   Acute pansinusitis 03/13/2020   Asthma 03/13/2020   Otitis media 03/13/2020   Acute strain of neck muscle 03/02/2014   Low back strain 03/02/2014   Chronic headaches 03/02/2013   GERD (gastroesophageal reflux disease) 03/02/2013   ADD (attention deficit disorder) 12/21/2012   MIGRAINE HEADACHE 09/26/2009   ACNE, MILD 09/26/2009   Home Medication(s) Prior to Admission medications   Medication Sig Start Date End Date Taking? Authorizing Provider  albuterol (PROVENTIL HFA) 108 (90 BASE) MCG/ACT inhaler Inhale 2 puffs into the lungs every 6 (six) hours as needed. Asthma attacks Patient taking differently: Inhale 2 puffs into the lungs as needed for wheezing or shortness of breath. Asthma attacks 03/02/14  Yes Bedsole, Amy E, MD  amphetamine-dextroamphetamine (ADDERALL) 10 MG tablet Take 10 mg by mouth daily as needed (focus).   Yes [provider]  cetirizine (ZYRTEC) 10 MG tablet Take 10 mg by mouth as needed for allergies. 01/15/19  Yes [provider]  clindamycin (CLINDAGEL)  1 % gel Apply 1 Application topically as needed (acne).   Yes [provider]  CONCERTA 54 MG CR tablet Take 54 mg by mouth daily. 09/30/20  Yes [provider]  fluticasone (FLONASE) 50 MCG/ACT nasal spray Place 1 spray into both nostrils as needed for allergies. 03/12/18  Yes [provider]  tretinoin (RETIN-A) 0.025 % cream Apply 1 application  topically at bedtime. Apply as directed a pea-sized amount evenly to face, avoid nose, eyes, corners of mouth; use strict sunscreen, do not use if pregnant. 02/20/22  Yes [provider]  UBRELVY 100 MG TABS Take 10 mg by mouth daily as needed (migraines). 03/02/20  Yes [provider]  etonogestrel (NEXPLANON) 68 MG IMPL implant 1 each by Subdermal route once. Patient not taking: Reported on 08/19/2022    [provider]  ibuprofen (ADVIL,MOTRIN) 800 MG tablet Take 1 tablet (800 mg total) by mouth every 8 (eight) hours as needed. Patient not taking: Reported on 08/19/2022 01/31/18   Brock Bad, MD  Lactic Ac-Citric Ac-Pot Bitart (PHEXXI) 1.8-1-0.4 % GEL 1 applicatorful per vagina up to 1 hour before intercourse Patient not taking: Reported on 08/19/2022 05/25/22   Constant, Peggy, MD  meloxicam (MOBIC) 15 MG tablet Take 1 tablet (15 mg total) by mouth daily. Patient not taking: Reported on 08/19/2022 10/21/20   Edwin Cap, DPM  tinidazole (TINDAMAX) 500 MG tablet Take 1 tablet (500 mg total) by mouth 2 (two) times daily. Patient not taking: Reported on 05/25/2022 08/13/20   Brock Bad, MD  drospirenone-ethinyl estradiol (YAZ) 3-0.02 MG per tablet Take 1 tablet by mouth daily. 07/21/10 06/18/11  Dianne Dun, MD                                                                                                                                    Past Surgical History Past Surgical History:  Procedure Laterality Date   WISDOM TOOTH EXTRACTION     Family History Family History  Problem Relation Age  of Onset   Heart disease Maternal Grandmother    Fibroids Maternal Grandmother    Heart disease Maternal Grandfather    Cancer Paternal Grandmother    Heart disease Paternal Grandfather    Hypertension Mother    Fibroids Mother    Multiple sclerosis Mother    Breast cancer Mother    Hypertension Father    Anesthesia problems Neg Hx    Hypotension Neg Hx    Malignant hyperthermia Neg Hx    Pseudochol deficiency Neg Hx     Social History Social History   Tobacco Use   Smoking status: Never   Smokeless tobacco: Never  Vaping Use   Vaping Use: Never used  Substance Use Topics   Alcohol use: Yes    Alcohol/week: 0.0 standard drinks of alcohol    Comment: socially    Drug use: No   Allergies Latex, Amoxicillin, Penicillins, Topiramate, and Sulfa antibiotics  Review of Systems Review of Systems  All other systems reviewed and are negative.   Physical Exam Vital Signs  I have reviewed the triage vital signs BP 133/77   Pulse 82   Temp 98.1 F (36.7 C) (Oral)   Resp 16   Ht 6\' 4"  (1.93 m)   Wt 113.9 kg   LMP 07/26/2022   SpO2 100%   BMI 30.55 kg/m  Physical Exam Vitals and nursing note reviewed.  Constitutional:      General: She is not in acute distress.    Appearance: She is well-developed.  HENT:     Head: Normocephalic and atraumatic.     Mouth/Throat:     Mouth: Mucous membranes are moist.  Eyes:     Pupils: Pupils are equal, round, and reactive to light.  Cardiovascular:     Rate and Rhythm: Normal rate and regular rhythm.     Heart sounds: No murmur heard. Pulmonary:     Effort: Pulmonary effort is normal. No respiratory distress.     Breath sounds: Normal breath sounds.  Abdominal:     General: Abdomen is flat.     Palpations: Abdomen is soft.     Tenderness: There is abdominal tenderness in the right upper quadrant and right lower quadrant. There is right CVA tenderness.  Musculoskeletal:        General: No tenderness.     Right lower leg:  No edema.     Left lower leg: No edema.  Skin:    General:  Skin is warm and dry.  Neurological:     General: No focal deficit present.     Mental Status: She is alert. Mental status is at baseline.  Psychiatric:        Mood and Affect: Mood normal.        Behavior: Behavior normal.     ED Results and Treatments Labs (all labs ordered are listed, but only abnormal results are displayed) Labs Reviewed  COMPREHENSIVE METABOLIC PANEL - Abnormal; Notable for the following components:      Result Value   Creatinine, Ser 1.21 (*)    Alkaline Phosphatase 32 (*)    GFR, Estimated 59 (*)    All other components within normal limits  URINALYSIS, ROUTINE W REFLEX MICROSCOPIC  PREGNANCY, URINE  CBC WITH DIFFERENTIAL/PLATELET  LIPASE, BLOOD                                                                                                                          Radiology US PELVIC COMPLETE W TRANSVAGINAL AND TORSION R/O  Result Date: 08/19/2022 CLINICAL DATA:  Right lower quadrant abdominal pain. EXAM: TRANSABDOMINAL AND TRANSVAGINAL ULTRASOUND OF PELVIS DOPPLER ULTRASOUND OF OVARIES TECHNIQUE: Both transabdominal and transvaginal ultrasound examinations of the pelvis were performed. Transabdominal technique was performed for global imaging of the pelvis including uterus, ovaries, adnexal regions, and pelvic cul-de-sac. It was necessary to proceed with endovaginal exam following the transabdominal exam to visualize the endometrium and ovaries. Color and duplex Doppler ultrasound was utilized to evaluate blood flow to the ovaries. COMPARISON:  CT examination performed earlier on the same date FINDINGS: Uterus Measurements: 8.9 x 5.4 x 7.1 = volume: 184.1 mL. Anterior fundal fibroid measuring 2.7 x 0.8 x 1.9 cm. Another small fibroid measuring 1.8 x 1.4 x 1.1 cm. Another smaller anterior fibroid measuring 1.5 x 1.1 x 1.0 cm. Endometrium Thickness: 9.7 mm.  No focal abnormality visualized. Right ovary  Measurements: 4.1 x 1.6 x 2.5 cm = volume: 8.4 mL. Normal appearance/no adnexal mass. Left ovary Measurements: 2.4 x 2.8 x 1.4 cm = volume: 4.0 mL. Normal appearance/no adnexal mass. Pulsed Doppler evaluation of right ovary demonstrates normal low-resistance arterial and venous waveforms. Left ovary not visualized transvaginally. Other findings No abnormal free fluid. IMPRESSION: 1. Small uterine fibroids. 2. Normal appearance of the ovaries. No adnexal mass or free fluid. 3. No evidence of right ovarian torsion. Left ovary is not visualized transvaginally. Left ovary is normal in size with normal color doppler flow on transabdominal examination. Electronically Signed   By: Larose Hires D.O.   On: 08/19/2022 14:37   CT ABDOMEN PELVIS W CONTRAST  Result Date: 08/19/2022 CLINICAL DATA:  Abdominal pain. RIGHT LOWER back pain started 1 and half weeks ago. EXAM: CT ABDOMEN AND PELVIS WITH CONTRAST TECHNIQUE: Multidetector CT imaging of the abdomen and pelvis was performed using the standard protocol following bolus administration of intravenous contrast. RADIATION DOSE REDUCTION: This exam was performed according to the departmental dose-optimization program  which includes automated exposure control, adjustment of the mA and/or kV according to patient size and/or use of iterative reconstruction technique. CONTRAST:  OMNIPAQUE IOHEXOL 300 MG/ML  SOLN COMPARISON:  None Available. FINDINGS: Lower chest: No acute abnormality. Hepatobiliary: Gallbladder is present. Within the posterior segment of the RIGHT hepatic lobe, there is an oval mass with nodular margins measuring 1.9 centimeters (image 26 of series 2). In the anterior segment RIGHT hepatic lobe, there is a 5 millimeter nodule, not fully characterize (image 14 of series 2). Otherwise normal is homogeneous and normal density. Pancreas: Unremarkable. No pancreatic ductal dilatation or surrounding inflammatory changes. Spleen: Normal in size without focal  abnormality. Adrenals/Urinary Tract: The adrenal glands are normal in appearance. 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 RIGHT ureter is. LEFT kidney is normal in appearance. No renal mass or hydronephrosis. Stomach/Bowel: The stomach and small bowel loops are normal in appearance. The appendix is well seen and normal in appearance. Moderate stool burden particularly within the ascending colon. Vascular/Lymphatic: There is prominent LEFT ovarian vein and dilated veins within the LEFT adnexal region, consistent with pelvic congestion. The RIGHT ovarian vein and parametrial vessels are normal in appearance. Reproductive: The uterus is present and contains sub centimeter subserosal fibroids, 1 of which is calcified in the fundal region. LEFT ovary is normal in size. Prominent parametrial vessels and enlarged LEFT ovarian vein, noted above. The RIGHT ovary contains a 2.0 centimeter low-attenuation lesion, likely ovarian cyst/follicle. In the setting of pelvic/back pain, consider further characterization with pelvic ultrasound. Other: No abdominal wall hernia or abnormality. No abdominopelvic ascites. Musculoskeletal: No acute or significant osseous findings. IMPRESSION: 1. No evidence for acute abnormality of the abdomen or pelvis. 2. Prominent LEFT ovarian vein and dilated LEFT parametrial vessels, consistent with pelvic congestion. 3. A 2.0 centimeter low-attenuation lesion in the RIGHT ovary, likely ovarian cyst/follicle. In the setting of pelvic/back pain, consider further characterization with pelvic ultrasound. 4. Normal appendix. 5. Moderate stool burden. 6. A 1.9 centimeters mass within the posterior segment of the RIGHT hepatic lobe, consistent with hemangioma. 7. A 5 millimeter nodule in the anterior segment RIGHT hepatic lobe, not fully characterized and statistically likely benign. Electronically Signed   By: Norva Pavlov M.D.   On: 08/19/2022 12:47    Pertinent labs & imaging results that  were available during my care of the patient were reviewed by me and considered in my medical decision making (see MDM for details).  Medications Ordered in ED Medications  ketorolac (TORADOL) 15 MG/ML injection 15 mg (15 mg Intravenous Given 08/19/22 1141)  oxyCODONE-acetaminophen (PERCOCET/ROXICET) 5-325 MG per tablet 1 tablet (1 tablet Oral Given 08/19/22 1142)  iohexol (OMNIPAQUE) 300 MG/ML solution 100 mL (100 mLs Intravenous Contrast Given 08/19/22 1218)  Procedures Procedures  (including critical care time)  Medical Decision Making / ED Course   MDM:  37 year old female with right-sided abdominal pain.  Patient overall well-appearing, vital signs reassuring.  Physical exam notable for right upper quadrant right flank tenderness.  Given persistent pain and tenderness, will check labs including CMP, lipase, will also obtain CT abdomen pelvis.  Will reassess.  Will treat pain with Toradol.  Differential includes nephrolithiasis, cholecystitis, biliary colic, hepatitis, appendicitis.  Less likely pelvic pathology given higher location but if CT unrevealing may pursue transvaginal ultrasound.  Clinical Course as of 08/19/22 1523  Wed Aug 19, 2022  1515 Patient reports her symptoms improved with treatment.  CT scan was unrevealing but did show possible right ovarian cyst, a pelvic ultrasound was obtained.  Pelvic ultrasound unrevealing including no signs of torsion.  Doubt intermittent torsion given her symptoms are constant.  Given patient earlier reported dyspareunia discussed pelvic exam to evaluate for cervical motion tenderness and PID.  Patient reports she is monogamous, has  no vaginal discharge, and the transvaginal ultrasound did not cause pain when pressed against her cervix so lower concern for this and patient prefers to defer pelvic exam at this time.   Unfortunately patient was disappointed that we were unable to identify a cause of her symptoms, I discussed her extensive workup which was ultimately reassuring, advise close follow-up with her primary physician and strict return precautions for any worsening of her symptoms. Also advised follow up with her OB/Gyn for intermittent dyspareunia [WS]    Clinical Course User Index [WS] Lonell Grandchild, MD     Lab Tests: -I ordered, reviewed, and interpreted labs.   The pertinent results include:   Labs Reviewed  COMPREHENSIVE METABOLIC PANEL - Abnormal; Notable for the following components:      Result Value   Creatinine, Ser 1.21 (*)    Alkaline Phosphatase 32 (*)    GFR, Estimated 59 (*)    All other components within normal limits  URINALYSIS, ROUTINE W REFLEX MICROSCOPIC  PREGNANCY, URINE  CBC WITH DIFFERENTIAL/PLATELET  LIPASE, BLOOD    Notable for very mild dehydration   Imaging Studies ordered: I ordered imaging studies including CT abdomen, US pelvis  On my interpretation imaging demonstrates no acute process, hemangioma, scattered small fibroids  I independently visualized and interpreted imaging. I agree with the radiologist interpretation   Medicines ordered and prescription drug management: Meds ordered this encounter  Medications   ketorolac (TORADOL) 15 MG/ML injection 15 mg   oxyCODONE-acetaminophen (PERCOCET/ROXICET) 5-325 MG per tablet 1 tablet   iohexol (OMNIPAQUE) 300 MG/ML solution 100 mL    -I have reviewed the patients home medicines and have made adjustments as needed   Social Determinants of Health:  Diagnosis or treatment significantly limited by social determinants of health: obesity   Reevaluation: After the interventions noted above, I reevaluated the patient and found that their symptoms have improved  Co morbidities that complicate the patient evaluation  Past Medical History:  Diagnosis Date   Asthma    exercise inducted   GERD  (gastroesophageal reflux disease)    Migraine headache    Migraines    Thrombocytopenia complicating pregnancy (HCC)       Dispostion: Disposition decision including need for hospitalization was considered, and patient discharged from emergency department.    Final Clinical Impression(s) / ED Diagnoses Final diagnoses:  Right flank pain     This chart was dictated using voice recognition software.  Despite best efforts to proofread,  errors can occur which can change the documentation meaning.    Lonell Grandchild, MD 08/19/22 [phone]

## 2022-08-19 NOTE — Discharge Instructions (Addendum)
We evaluated you for your abdominal pain.  Your workup included a CT scan and pelvic ultrasound that did not show any dangerous process as a cause of your abdominal pain.  We did not see any signs of a kidney stone, gallbladder infection, appendicitis, urinary infection, ovarian cyst, or other dangerous process.   Please follow-up closely with your primary physician. Please take Tylenol and Motrin for your symptoms at home.  You can take 1000 mg of Tylenol every 6 hours and 600 mg of ibuprofen every 6 hours as needed for your symptoms.  You can take these medicines together as needed, either at the same time, or alternating every 3 hours.  If your symptoms worsen or you develop any new or worrisome symptoms such as pelvic pain, vaginal discharge, painful urination, nausea or vomiting, fevers, or any other concerning symptoms, please return to the emergency department.

## 2022-08-19 NOTE — ED Notes (Signed)
Patient transported to CT 

## 2022-08-19 NOTE — ED Triage Notes (Signed)
Patient arrives ambulatory by POV c/o right lower back pain onset of about a week and half ago. Thought it was related to needing new mattress. Has been trying to stretch and crack back. A few days ago began having lower right sided abdominal pain. Denies any N/V/D or any urinary symptoms.

## 2022-09-12 DIAGNOSIS — D126 Benign neoplasm of colon, unspecified: Secondary | ICD-10-CM | POA: Insufficient documentation

## 2022-10-01 HISTORY — PX: COLONOSCOPY: SHX174

## 2022-11-24 ENCOUNTER — Ambulatory Visit: Payer: 59

## 2022-11-24 DIAGNOSIS — Z3201 Encounter for pregnancy test, result positive: Secondary | ICD-10-CM

## 2022-11-24 LAB — POCT URINE PREGNANCY: Preg Test, Ur: POSITIVE — AB

## 2022-11-24 NOTE — Progress Notes (Signed)
Erica Wyatt here for a UPT. Pt had a positive upt at home. LMP is 10/12/2022.   UPT in office Positive.    Reviewed medications and informed to start a PNV, if not already. Pt to follow up 12/09/2022 for New OB intake.

## 2022-12-08 ENCOUNTER — Encounter: Payer: Self-pay | Admitting: *Deleted

## 2022-12-08 ENCOUNTER — Other Ambulatory Visit: Payer: Self-pay | Admitting: *Deleted

## 2022-12-09 ENCOUNTER — Other Ambulatory Visit (INDEPENDENT_AMBULATORY_CARE_PROVIDER_SITE_OTHER): Payer: 59

## 2022-12-09 ENCOUNTER — Ambulatory Visit (INDEPENDENT_AMBULATORY_CARE_PROVIDER_SITE_OTHER): Payer: 59 | Admitting: *Deleted

## 2022-12-09 VITALS — BP 119/78 | HR 82 | Wt 261.0 lb

## 2022-12-09 DIAGNOSIS — Z3481 Encounter for supervision of other normal pregnancy, first trimester: Secondary | ICD-10-CM

## 2022-12-09 DIAGNOSIS — Z3A08 8 weeks gestation of pregnancy: Secondary | ICD-10-CM

## 2022-12-09 DIAGNOSIS — O3680X0 Pregnancy with inconclusive fetal viability, not applicable or unspecified: Secondary | ICD-10-CM

## 2022-12-09 DIAGNOSIS — Z1339 Encounter for screening examination for other mental health and behavioral disorders: Secondary | ICD-10-CM

## 2022-12-09 DIAGNOSIS — Z3480 Encounter for supervision of other normal pregnancy, unspecified trimester: Secondary | ICD-10-CM | POA: Insufficient documentation

## 2022-12-09 NOTE — Patient Instructions (Signed)

## 2022-12-09 NOTE — Progress Notes (Signed)
New OB Intake  I connected with Erica Wyatt  on 12/09/22 at  9:15 AM EDT by In Person Visit and verified that I am speaking with the correct person using two identifiers. Nurse is located at [facility] and pt is located at [place].  I discussed the limitations, risks, security and privacy concerns of performing an evaluation and management service by telephone and the availability of in person appointments. I also discussed with the patient that there may be a patient responsible charge related to this service. The patient expressed understanding and agreed to proceed.  I explained I am completing New OB Intake today. We discussed EDD of 07/19/2023, by Last Menstrual Period. Pt is [id number]. I reviewed her allergies, medications and Medical/Surgical/OB history.    Patient Active Problem List   Diagnosis Date Noted   Tubular adenoma of colon 09/12/2022   Acute pansinusitis 03/13/2020   Asthma 03/13/2020   Otitis media 03/13/2020   Acute strain of neck muscle 03/02/2014   Low back strain 03/02/2014   Chronic headaches 03/02/2013   GERD (gastroesophageal reflux disease) 03/02/2013   ADD (attention deficit disorder) 12/21/2012   MIGRAINE HEADACHE 09/26/2009   ACNE, MILD 09/26/2009    Concerns addressed today  Delivery Plans Plans to deliver at [facility]. Discussed the nature of our practice with multiple providers including residents and students. Due to the size of the practice, the delivering provider may not be the same as those providing prenatal care.   Patient is interested in water birth. Offered upcoming OB visit with CNM to discuss further.  MyChart/Babyscripts MyChart access verified. I explained pt will have some visits in office and some virtually. Babyscripts instructions given and order placed. Patient verifies receipt of registration text/e-mail. Account successfully created and app downloaded.  Blood Pressure Cuff/Weight Scale Patient has private insurance; instructed to  purchase blood pressure cuff and bring to first prenatal appt. Explained after first prenatal appt pt will check weekly and document in Babyscripts. Patient does not have weight scale; patient may purchase if they desire to track weight weekly in Babyscripts.  Anatomy US Explained first scheduled [place] will be around 19 weeks. Anatomy US scheduled for TBD at TBD.  Interested in [place]? If yes, send referral and doula dot phrase.   Is patient a candidate for Babyscripts Optimization? Yes  First visit review I reviewed new OB appt with patient. Explained pt will be seen by Sharen Counter, CNM at first visit. Discussed Avelina Laine genetic screening with patient. Requests Panorama and Horizon.. Routine prenatal labs  OB Panel, OB Urine collected today.    Last Pap Diagnosis  Date Value Ref Range Status  05/25/2022   Final   - Negative for intraepithelial lesion or malignancy (NILM)    Harrel Lemon, RN 12/09/2022  9:28 AM

## 2022-12-10 LAB — CBC/D/PLT+RPR+RH+ABO+RUBIGG...
Antibody Screen: NEGATIVE
Basophils Absolute: 0 10*3/uL (ref 0.0–0.2)
Basos: 1 %
EOS (ABSOLUTE): 0.2 10*3/uL (ref 0.0–0.4)
Eos: 3 %
HCV Ab: NONREACTIVE
HIV Screen 4th Generation wRfx: NONREACTIVE
Hematocrit: 34.5 % (ref 34.0–46.6)
Hemoglobin: 11.8 g/dL (ref 11.1–15.9)
Hepatitis B Surface Ag: NEGATIVE
Immature Grans (Abs): 0 10*3/uL (ref 0.0–0.1)
Immature Granulocytes: 0 %
Lymphocytes Absolute: 1.6 10*3/uL (ref 0.7–3.1)
Lymphs: 31 %
MCH: 29.9 pg (ref 26.6–33.0)
MCHC: 34.2 g/dL (ref 31.5–35.7)
MCV: 87 fL (ref 79–97)
Monocytes Absolute: 0.5 10*3/uL (ref 0.1–0.9)
Monocytes: 10 %
Neutrophils Absolute: 2.9 10*3/uL (ref 1.4–7.0)
Neutrophils: 55 %
Platelets: 161 10*3/uL (ref 150–450)
RBC: 3.95 x10E6/uL (ref 3.77–5.28)
RDW: 11.9 % (ref 11.7–15.4)
RPR Ser Ql: NONREACTIVE
Rh Factor: POSITIVE
Rubella Antibodies, IGG: 2 {index} (ref >0.99)
WBC: 5.2 10*3/uL (ref 3.4–10.8)

## 2022-12-10 LAB — HEMOGLOBIN A1C
Est. average glucose Bld gHb Est-mCnc: 117 mg/dL
Hgb A1c MFr Bld: 5.7 % — ABNORMAL HIGH (ref 4.8–5.6)

## 2022-12-10 LAB — HCV INTERPRETATION

## 2022-12-11 LAB — CULTURE, OB URINE

## 2022-12-11 LAB — URINE CULTURE, OB REFLEX

## 2023-01-11 ENCOUNTER — Encounter: Payer: Self-pay | Admitting: Advanced Practice Midwife

## 2023-01-11 ENCOUNTER — Institutional Professional Consult (permissible substitution): Payer: Self-pay | Admitting: Licensed Clinical Social Worker

## 2023-01-11 ENCOUNTER — Other Ambulatory Visit (HOSPITAL_COMMUNITY)
Admission: RE | Admit: 2023-01-11 | Discharge: 2023-01-11 | Disposition: A | Discharge status: Home / Self Care | Payer: 59 | Source: Ambulatory Visit | Attending: Advanced Practice Midwife | Admitting: Advanced Practice Midwife

## 2023-01-11 ENCOUNTER — Ambulatory Visit (INDEPENDENT_AMBULATORY_CARE_PROVIDER_SITE_OTHER): Payer: 59 | Admitting: Advanced Practice Midwife

## 2023-01-11 VITALS — BP 128/78 | HR 80 | Wt 263.4 lb

## 2023-01-11 DIAGNOSIS — Z3480 Encounter for supervision of other normal pregnancy, unspecified trimester: Secondary | ICD-10-CM

## 2023-01-11 DIAGNOSIS — R12 Heartburn: Secondary | ICD-10-CM | POA: Diagnosis not present

## 2023-01-11 DIAGNOSIS — R7309 Other abnormal glucose: Secondary | ICD-10-CM

## 2023-01-11 DIAGNOSIS — Z1339 Encounter for screening examination for other mental health and behavioral disorders: Secondary | ICD-10-CM

## 2023-01-11 DIAGNOSIS — Z3A13 13 weeks gestation of pregnancy: Secondary | ICD-10-CM | POA: Diagnosis not present

## 2023-01-11 DIAGNOSIS — O26891 Other specified pregnancy related conditions, first trimester: Secondary | ICD-10-CM

## 2023-01-11 DIAGNOSIS — Z3482 Encounter for supervision of other normal pregnancy, second trimester: Secondary | ICD-10-CM

## 2023-01-11 DIAGNOSIS — O26892 Other specified pregnancy related conditions, second trimester: Secondary | ICD-10-CM

## 2023-01-11 MED ORDER — FAMOTIDINE 20 MG PO TABS
20.0000 mg | ORAL_TABLET | Freq: Two times a day (BID) | ORAL | 2 refills | Status: DC | PRN
Start: 2023-01-11 — End: 2023-07-22

## 2023-01-11 MED ORDER — ASPIRIN 81 MG PO TBEC
81.0000 mg | DELAYED_RELEASE_TABLET | Freq: Every day | ORAL | 12 refills | Status: DC
Start: 2023-01-11 — End: 2023-07-22

## 2023-01-11 NOTE — Progress Notes (Signed)
Subjective:   Erica Wyatt is a 37 y.o. G2P1001 at [redacted]w[redacted]d by LMP being seen today for her first obstetrical visit.  Her obstetrical history is significant for  none G1  and has MIGRAINE HEADACHE; ACNE, MILD; ADD (attention deficit disorder); Chronic headaches; GERD (gastroesophageal reflux disease); Acute strain of neck muscle; Low back strain; Acute pansinusitis; Asthma; Otitis media; Tubular adenoma of colon; and Supervision of other normal pregnancy, antepartum on their problem list.. Patient does intend to breast feed. Pregnancy history fully reviewed.  Patient reports heartburn.  HISTORY: OB History  Gravida Para Term Preterm AB Living  2 1 1  0 0 1  SAB IAB Ectopic Multiple Live Births  0 0 0 0 1    # Outcome Date GA Lbr Len/2nd Weight Sex Type Anes PTL Lv  2 Current           1 Term 12/16/11 [redacted]w[redacted]d 19:20 / 01:14 8 lb 1.5 oz (3.671 kg) M Vag-Spont EPI  LIV     Birth Comments: normal newborn     Name: Goodridge,BOY Meshelle     Apgar1: 7  Apgar5: 8   Past Medical History:  Diagnosis Date   Asthma    exercise inducted   GERD (gastroesophageal reflux disease)    Migraine headache    Migraines    Thrombocytopenia complicating pregnancy (HCC)    Past Surgical History:  Procedure Laterality Date   COLONOSCOPY  10/01/2022   benign poly removed   FOOT SPLIT TRANSFER OF THE POSTERIOR TIBIALIS TENDON PROCEDURE  10/08/2017   Dr. Ernestene Kiel   removal fatty tumor Left    dermatology, left upper thigh   WISDOM TOOTH EXTRACTION     Family History  Problem Relation Age of Onset   Hypertension Mother    Fibroids Mother    Multiple sclerosis Mother    Breast cancer Mother    Hypertension Father    Alzheimer's disease Father    Heart disease Maternal Grandmother    Fibroids Maternal Grandmother    Heart disease Maternal Grandfather    Cancer Paternal Grandmother    Heart disease Paternal Grandfather    Anesthesia problems Neg Hx    Hypotension Neg Hx    Malignant  hyperthermia Neg Hx    Pseudochol deficiency Neg Hx    Social History   Tobacco Use   Smoking status: Never   Smokeless tobacco: Never  Vaping Use   Vaping status: Never Used  Substance Use Topics   Alcohol use: Yes    Alcohol/week: 0.0 standard drinks of alcohol    Comment: socially    Drug use: No   Allergies  Allergen Reactions   Latex Rash and Other (See Comments)    Irritation to mucous membranes, especially vaginal tissue    Amoxicillin Swelling and Other (See Comments)    Childhood reaction   Penicillins Swelling and Other (See Comments)    Childhood reaction   Topiramate Other (See Comments)    Suicidal thoughts     Sulfa Antibiotics Swelling and Rash   Current Outpatient Medications on File Prior to Visit  Medication Sig Dispense Refill   Prenatal Vit-Fe Fumarate-FA (MULTIVITAMIN-PRENATAL) 27-0.8 MG TABS tablet Take 1 tablet by mouth daily at 12 noon.     albuterol (PROVENTIL HFA) 108 (90 BASE) MCG/ACT inhaler Inhale 2 puffs into the lungs every 6 (six) hours as needed. Asthma attacks (Patient not taking: Reported on 12/09/2022) 1 Inhaler 0   amphetamine-dextroamphetamine (ADDERALL) 10 MG tablet Take  10 mg by mouth daily as needed (focus). (Patient not taking: Reported on 01/11/2023)     cetirizine (ZYRTEC) 10 MG tablet Take 10 mg by mouth as needed for allergies. (Patient not taking: Reported on 12/09/2022)     clindamycin (CLINDAGEL) 1 % gel Apply 1 Application topically as needed (acne). (Patient not taking: Reported on 11/24/2022)     CONCERTA 54 MG CR tablet Take 54 mg by mouth daily. (Patient not taking: Reported on 01/11/2023)     etonogestrel (NEXPLANON) 68 MG IMPL implant 1 each by Subdermal route once. (Patient not taking: Reported on 01/11/2023)     fluticasone (FLONASE) 50 MCG/ACT nasal spray Place 1 spray into both nostrils as needed for allergies. (Patient not taking: Reported on 01/11/2023)  0   ibuprofen (ADVIL,MOTRIN) 800 MG tablet Take 1 tablet (800 mg  total) by mouth every 8 (eight) hours as needed. (Patient not taking: Reported on 01/11/2023) 30 tablet 5   Lactic Ac-Citric Ac-Pot Bitart (PHEXXI) 1.8-1-0.4 % GEL 1 applicatorful per vagina up to 1 hour before intercourse (Patient not taking: Reported on 01/11/2023) 25 g 5   meloxicam (MOBIC) 15 MG tablet Take 1 tablet (15 mg total) by mouth daily. (Patient not taking: Reported on 01/11/2023) 30 tablet 3   tinidazole (TINDAMAX) 500 MG tablet Take 1 tablet (500 mg total) by mouth 2 (two) times daily. (Patient not taking: Reported on 01/11/2023) 14 tablet 2   tretinoin (RETIN-A) 0.025 % cream Apply 1 application  topically at bedtime. Apply as directed a pea-sized amount evenly to face, avoid nose, eyes, corners of mouth; use strict sunscreen, do not use if pregnant. (Patient not taking: Reported on 11/24/2022)     UBRELVY 100 MG TABS Take 10 mg by mouth daily as needed (migraines). (Patient not taking: Reported on 12/09/2022)     [DISCONTINUED] drospirenone-ethinyl estradiol (YAZ) 3-0.02 MG per tablet Take 1 tablet by mouth daily. 30 tablet 11   No current facility-administered medications on file prior to visit.     Indications for ASA therapy (per uptodate) One of the following: Previous pregnancy with preeclampsia, especially early onset and with an adverse outcome No Multifetal gestation No Chronic hypertension No Type 1 or 2 diabetes mellitus No Chronic kidney disease No Autoimmune disease (antiphospholipid syndrome, systemic lupus erythematosus) No   Two or more of the following: Nulliparity No Obesity (body mass index >30 kg/m2) Yes Family history of preeclampsia in mother or sister Yes Age >=35 years Yes Sociodemographic characteristics (African American race, low socioeconomic level) Yes Personal risk factors (eg, previous pregnancy with low birth weight or small for gestational age infant, previous adverse pregnancy outcome [eg, stillbirth], interval >10 years between pregnancies)  No   Indications for early 1 hour GTT (per uptodate)  BMI >25 (>23 in Asian women) AND one of the following  Gestational diabetes mellitus in a previous pregnancy No Glycated hemoglobin >=5.7 percent (39 mmol/mol), impaired glucose tolerance, or impaired fasting glucose on previous testing Yes First-degree relative with diabetes No High-risk race/ethnicity (eg, African American, Latino, Native American, [place] American, Pacific Islander) Yes History of cardiovascular disease No Hypertension or on therapy for hypertension No High-density lipoprotein cholesterol level <35 mg/dL (4.25 mmol/L) and/or a triglyceride level >250 mg/dL (9.56 mmol/L) No Polycystic ovary syndrome No Physical inactivity No Other clinical condition associated with insulin resistance (eg, severe obesity, acanthosis nigricans) No Previous birth of an infant weighing >=4000 g No Previous stillbirth of unknown cause No Exam   Vitals:   01/11/23 1350  BP: 128/78  Pulse: 80  Weight: 263 lb 6.4 oz (119.5 kg)   Fetal Heart Rate (bpm): 147  Uterus:     Pelvic Exam: Perineum: no hemorrhoids, normal perineum   Vulva: normal external genitalia, no lesions   Vagina:  normal mucosa, normal discharge   Cervix: no lesions and normal, pap smear done.    Adnexa: normal adnexa and no mass, fullness, tenderness   Bony Pelvis: average  System: General: well-developed, well-nourished female in no acute distress   Breast:  normal appearance, no masses or tenderness   Skin: normal coloration and turgor, no rashes   Neurologic: oriented, normal, negative, normal mood   Extremities: normal strength, tone, and muscle mass, ROM of all joints is normal   HEENT PERRLA, extraocular movement intact and sclera clear, anicteric   Mouth/Teeth mucous membranes moist, pharynx normal without lesions and dental hygiene good   Neck supple and no masses   Cardiovascular: regular rate and rhythm   Respiratory:  no respiratory distress, normal  breath sounds   Abdomen: soft, non-tender; bowel sounds normal; no masses,  no organomegaly     Assessment:   Pregnancy: G2P1001 Patient Active Problem List   Diagnosis Date Noted   Supervision of other normal pregnancy, antepartum 12/09/2022   Tubular adenoma of colon 09/12/2022   Acute pansinusitis 03/13/2020   Asthma 03/13/2020   Otitis media 03/13/2020   Acute strain of neck muscle 03/02/2014   Low back strain 03/02/2014   Chronic headaches 03/02/2013   GERD (gastroesophageal reflux disease) 03/02/2013   ADD (attention deficit disorder) 12/21/2012   MIGRAINE HEADACHE 09/26/2009   ACNE, MILD 09/26/2009     Plan:  1. Supervision of other normal pregnancy, antepartum --Anticipatory guidance about next visits/weeks of pregnancy given.  --Reviewed common pregnancy symptoms, including nausea, feeling too full after eating, heartburn and diet and medical solutions offered. - Cervicovaginal ancillary only( [facility]) - aspirin EC 81 MG tablet; Take 1 tablet (81 mg total) by mouth daily. Swallow whole.  Dispense: 30 tablet; Refill: 12  2. Encounter for supervision of other normal pregnancy in second trimester  - HORIZON Basic Panel - PANORAMA PRENATAL TEST  3. Heartburn during pregnancy in second trimester  - famotidine (PEPCID) 20 MG tablet; Take 1 tablet (20 mg total) by mouth 2 (two) times daily as needed for heartburn or indigestion.  Dispense: 30 tablet; Refill: 2  4. [redacted] weeks gestation of pregnancy   5. Elevated hemoglobin A1c --A1C 5.7 on early screening, will do 2 hour GTT at next visit    Initial labs reviewed Continue prenatal vitamins. Discussed and offered genetic screening options, including Quad screen/AFP, NIPS testing, and option to decline testing. Benefits/risks/alternatives reviewed. Pt aware that anatomy US is form of genetic screening with lower accuracy in detecting trisomies than blood work.  Pt chooses genetic screening today. NIPS:  ordered. Ultrasound discussed; fetal anatomic survey: ordered. Problem list reviewed and updated. The nature of [facility] with multiple MDs and other Advanced Practice Providers was explained to patient; also emphasized that residents, students are part of our team. Routine obstetric precautions reviewed. Return in about 4 weeks (around 02/08/2023) for Early GTT at next visit.   Sharen Counter, CNM 01/11/23 6:48 PM

## 2023-01-11 NOTE — Progress Notes (Signed)
NOB in office, intake and u/s completed on 12/09/22 Last pap 05/25/2022

## 2023-01-12 LAB — CERVICOVAGINAL ANCILLARY ONLY
Chlamydia: NEGATIVE
Comment: NEGATIVE
Comment: NEGATIVE
Comment: NORMAL
Neisseria Gonorrhea: NEGATIVE
Trichomonas: NEGATIVE

## 2023-01-17 LAB — PANORAMA PRENATAL TEST FULL PANEL:PANORAMA TEST PLUS 5 ADDITIONAL MICRODELETIONS: FETAL FRACTION: 7.1

## 2023-01-21 LAB — HORIZON CUSTOM: REPORT SUMMARY: NEGATIVE

## 2023-01-25 ENCOUNTER — Encounter: Payer: Self-pay | Admitting: Obstetrics and Gynecology

## 2023-01-31 ENCOUNTER — Other Ambulatory Visit: Payer: Self-pay | Admitting: Advanced Practice Midwife

## 2023-01-31 DIAGNOSIS — O26892 Other specified pregnancy related conditions, second trimester: Secondary | ICD-10-CM

## 2023-02-08 ENCOUNTER — Ambulatory Visit (INDEPENDENT_AMBULATORY_CARE_PROVIDER_SITE_OTHER): Payer: 59 | Admitting: Obstetrics and Gynecology

## 2023-02-08 ENCOUNTER — Encounter: Payer: Self-pay | Admitting: Obstetrics and Gynecology

## 2023-02-08 ENCOUNTER — Other Ambulatory Visit: Payer: 59

## 2023-02-08 VITALS — BP 118/75 | HR 78 | Wt 268.0 lb

## 2023-02-08 DIAGNOSIS — O26892 Other specified pregnancy related conditions, second trimester: Secondary | ICD-10-CM

## 2023-02-08 DIAGNOSIS — Z3480 Encounter for supervision of other normal pregnancy, unspecified trimester: Secondary | ICD-10-CM

## 2023-02-08 DIAGNOSIS — O09529 Supervision of elderly multigravida, unspecified trimester: Secondary | ICD-10-CM | POA: Insufficient documentation

## 2023-02-08 DIAGNOSIS — R12 Heartburn: Secondary | ICD-10-CM

## 2023-02-08 DIAGNOSIS — O99210 Obesity complicating pregnancy, unspecified trimester: Secondary | ICD-10-CM | POA: Insufficient documentation

## 2023-02-08 MED ORDER — PANTOPRAZOLE SODIUM 20 MG PO TBEC
20.0000 mg | DELAYED_RELEASE_TABLET | Freq: Every day | ORAL | 2 refills | Status: DC
Start: 2023-02-08 — End: 2023-07-22

## 2023-02-08 NOTE — Progress Notes (Signed)
   PRENATAL VISIT NOTE  Subjective:  Erica Wyatt is a 37 y.o. G2P1001 at [redacted]w[redacted]d being seen today for ongoing prenatal care.  She is currently monitored for the following issues for this high-risk pregnancy and has Migraine headache; ACNE, MILD; ADD (attention deficit disorder); Chronic headaches; GERD (gastroesophageal reflux disease); Acute strain of neck muscle; Low back strain; Acute pansinusitis; Asthma; Otitis media; Tubular adenoma of colon; and Supervision of other normal pregnancy, antepartum on their problem list.  Patient reports no complaints.  Contractions: Not present. Vag. Bleeding: None.   . Denies leaking of fluid.   The following portions of the patient's history were reviewed and updated as appropriate: allergies, current medications, past family history, past medical history, past social history, past surgical history and problem list.   Objective:   Vitals:   02/08/23 0914  BP: 118/75  Pulse: 78  Weight: 268 lb (121.6 kg)    Fetal Status: Fetal Heart Rate (bpm): 135         General:  Alert, oriented and cooperative. Patient is in no acute distress.  Skin: Skin is warm and dry. No rash noted.   Cardiovascular: Normal heart rate noted  Respiratory: Normal respiratory effort, no problems with respiration noted  Abdomen: Soft, gravid, appropriate for gestational age.  Pain/Pressure: Absent     Pelvic: Cervical exam deferred        Extremities: Normal range of motion.     Mental Status: Normal mood and affect. Normal behavior. Normal judgment and thought content.   Assessment and Plan:  Pregnancy: G2P1001 at [redacted]w[redacted]d 1. Supervision of other normal pregnancy, antepartum Patient is doing well without complaints Anatomy ultrasound scheduled - Glucose Tolerance, 2 Hours w/1 Hour - AFP, Serum, Open Spina Bifida  Preterm labor symptoms and general obstetric precautions including but not limited to vaginal bleeding, contractions, leaking of fluid and fetal movement were  reviewed in detail with the patient. Please refer to After Visit Summary for other counseling recommendations.   Return in about 4 weeks (around 03/08/2023) for in person, ROB, Low risk.  Future Appointments  Date Time Provider Department Center  02/22/2023 12:15 PM [facility] NURSE [facility]  02/22/2023 12:30 PM [facility] US2 [facility]    Catalina Antigua, MD

## 2023-02-09 LAB — GLUCOSE TOLERANCE, 2 HOURS W/ 1HR
Glucose, 1 hour: 101 mg/dL (ref 70–179)
Glucose, 2 hour: 70 mg/dL (ref 70–152)
Glucose, Fasting: 78 mg/dL (ref 70–91)

## 2023-02-10 LAB — AFP, SERUM, OPEN SPINA BIFIDA
AFP MoM: 1.19
AFP Value: 35.1 ng/mL
Gest. Age on Collection Date: 17 wk
Maternal Age At EDD: 38.7 a
OSBR Risk 1 IN: 10000
Test Results:: NEGATIVE
Weight: 268 [lb_av]

## 2023-02-22 ENCOUNTER — Ambulatory Visit: Payer: 59 | Attending: Obstetrics and Gynecology

## 2023-02-22 ENCOUNTER — Ambulatory Visit: Payer: 59

## 2023-02-22 DIAGNOSIS — O99210 Obesity complicating pregnancy, unspecified trimester: Secondary | ICD-10-CM

## 2023-02-22 DIAGNOSIS — O09522 Supervision of elderly multigravida, second trimester: Secondary | ICD-10-CM

## 2023-03-05 ENCOUNTER — Telehealth: Payer: Self-pay

## 2023-03-05 NOTE — Telephone Encounter (Signed)
No message left - will send MyChart Message - want to move patient into the 7:30am Anatomy appointment on 12/3, due to scheduling errors on New Template

## 2023-03-08 ENCOUNTER — Ambulatory Visit (INDEPENDENT_AMBULATORY_CARE_PROVIDER_SITE_OTHER): Payer: 59 | Admitting: Obstetrics & Gynecology

## 2023-03-08 ENCOUNTER — Encounter: Payer: 59 | Admitting: Obstetrics & Gynecology

## 2023-03-08 VITALS — BP 110/75 | HR 80 | Wt 271.0 lb

## 2023-03-08 DIAGNOSIS — K219 Gastro-esophageal reflux disease without esophagitis: Secondary | ICD-10-CM

## 2023-03-08 DIAGNOSIS — Z3480 Encounter for supervision of other normal pregnancy, unspecified trimester: Secondary | ICD-10-CM

## 2023-03-08 DIAGNOSIS — O09522 Supervision of elderly multigravida, second trimester: Secondary | ICD-10-CM

## 2023-03-08 NOTE — Progress Notes (Signed)
   PRENATAL VISIT NOTE  Subjective:  Erica Wyatt is a 37 y.o. G2P1001 at [redacted]w[redacted]d being seen today for ongoing prenatal care.  She is currently monitored for the following issues for this high-risk pregnancy and has Migraine headache; ADD (attention deficit disorder); Chronic headaches; GERD (gastroesophageal reflux disease); Acute pansinusitis; Asthma; Tubular adenoma of colon; Supervision of other normal pregnancy, antepartum; AMA (advanced maternal age) multigravida 35+; and Obesity affecting pregnancy, antepartum on their problem list.  Patient reports no complaints.  Contractions: Not present. Vag. Bleeding: None.   . Denies leaking of fluid.   The following portions of the patient's history were reviewed and updated as appropriate: allergies, current medications, past family history, past medical history, past social history, past surgical history and problem list.   Objective:   Vitals:   03/08/23 0936  BP: 110/75  Pulse: 80  Weight: 271 lb (122.9 kg)    Fetal Status: Fetal Heart Rate (bpm): 140         General:  Alert, oriented and cooperative. Patient is in no acute distress.  Skin: Skin is warm and dry. No rash noted.   Cardiovascular: Normal heart rate noted  Respiratory: Normal respiratory effort, no problems with respiration noted  Abdomen: Soft, gravid, appropriate for gestational age.  Pain/Pressure: Absent     Pelvic: Cervical exam deferred        Extremities: Normal range of motion.     Mental Status: Normal mood and affect. Normal behavior. Normal judgment and thought content.   Assessment and Plan:  Pregnancy: G2P1001 at [redacted]w[redacted]d 1. Multigravida of advanced maternal age in second trimester [place] 12/9  2. Supervision of other normal pregnancy, antepartum   3. Gastroesophageal reflux disease, unspecified whether esophagitis present On Protonix  Preterm labor symptoms and general obstetric precautions including but not limited to vaginal bleeding, contractions,  leaking of fluid and fetal movement were reviewed in detail with the patient. Please refer to After Visit Summary for other counseling recommendations.   Return in about 4 weeks (around 04/05/2023).  Future Appointments  Date Time Provider Department Center  03/22/2023  7:15 AM [facility] NURSE [facility]  03/22/2023  7:30 AM [facility] US4 [facility]  04/05/2023  9:35 AM Sue Lush, FNP [facility] None    Scheryl Darter, MD

## 2023-03-08 NOTE — Progress Notes (Signed)
Pt has had some mild cramping.  Pt is not yet feeling movement.

## 2023-03-22 ENCOUNTER — Ambulatory Visit: Payer: 59

## 2023-04-05 ENCOUNTER — Encounter: Payer: 59 | Admitting: Obstetrics and Gynecology

## 2023-04-14 NOTE — L&D Delivery Note (Signed)
 [facility]N [facility] Delivery Note  Erica Wyatt is a 38 y.o. G2P1001 s/p NSVD at [redacted]w[redacted]d. She was admitted for SROM.   ROM: 9h 77m with clear fluid GBS Status: Positive/-- (03/12 1042) Maximum Maternal Temperature: Temp (24hrs), Avg:98.5 F (36.9 C), Min:98.3 F (36.8 C), Max:98.7 F (37.1 C)   Labor Progress: Initial SVE: 3/60/-3.  No augmentation  required. She then progressed to complete.   Delivery Date/Time: 07/21/23 0215 Delivery: Called to room and patient was crowning. Head delivered OA to LOA with one push. No nuchal cord present. Shoulder and body delivered in usual fashion. Infant with spontaneous cry, placed on mother's abdomen, dried and stimulated. Cord clamped x 2 after +1-minute delay, and cut by Grandma. Cord gases not obtained. Cord blood drawn. Placenta delivered spontaneously with gentle cord traction. Fundus firm with massage and Pitocin. Labia, perineum, and vagina inspected with  left periurethral abrasion. Mom and baby to postpartum. Baby Weight: pending  Placenta: 3 vessel, intact. Sent to L&D Complications: None Lacerations: abrasion EBL: 50 mL Anesthesia: epidural  Infant: baby girl [place]ox APGAR (1 MIN): 9  APGAR (5 MINS): 9  APGAR (10 MINS):    Joanne Gavel, MD [facility] Fellow, [facility] for [facility], [facility] 07/21/2023, 2:35 AM

## 2023-04-19 ENCOUNTER — Ambulatory Visit: Payer: 59 | Attending: Obstetrics and Gynecology | Admitting: Obstetrics

## 2023-04-19 ENCOUNTER — Ambulatory Visit: Payer: 59 | Attending: Obstetrics and Gynecology

## 2023-04-19 ENCOUNTER — Other Ambulatory Visit: Payer: Self-pay

## 2023-04-19 ENCOUNTER — Other Ambulatory Visit: Payer: Self-pay | Admitting: *Deleted

## 2023-04-19 ENCOUNTER — Ambulatory Visit: Payer: 59 | Admitting: *Deleted

## 2023-04-19 ENCOUNTER — Encounter: Payer: Self-pay | Admitting: *Deleted

## 2023-04-19 VITALS — BP 111/71 | HR 87

## 2023-04-19 DIAGNOSIS — O09522 Supervision of elderly multigravida, second trimester: Secondary | ICD-10-CM | POA: Insufficient documentation

## 2023-04-19 DIAGNOSIS — O99212 Obesity complicating pregnancy, second trimester: Secondary | ICD-10-CM | POA: Diagnosis not present

## 2023-04-19 DIAGNOSIS — Z3689 Encounter for other specified antenatal screening: Secondary | ICD-10-CM

## 2023-04-19 DIAGNOSIS — Z3A27 27 weeks gestation of pregnancy: Secondary | ICD-10-CM | POA: Diagnosis not present

## 2023-04-19 DIAGNOSIS — Z3480 Encounter for supervision of other normal pregnancy, unspecified trimester: Secondary | ICD-10-CM | POA: Diagnosis not present

## 2023-04-19 DIAGNOSIS — Z362 Encounter for other antenatal screening follow-up: Secondary | ICD-10-CM

## 2023-04-19 NOTE — Progress Notes (Signed)
 MFM Note  Erica Wyatt is currently at 27 weeks and 0 days.  She was seen for a detailed fetal anatomy scan due to advanced maternal age (38 years old).  She denies any significant past medical history and denies any problems in her current pregnancy.    She had a cell free DNA test earlier in her pregnancy which indicated a low risk for trisomy 69, 59, and 13. A female fetus is predicted.   She was informed that the fetal growth and amniotic fluid level were appropriate for her gestational age.   There were no obvious fetal anomalies noted on today's ultrasound exam.  The patient was informed that anomalies may be missed due to technical limitations. If the fetus is in a suboptimal position or maternal habitus is increased, visualization of the fetus in the maternal uterus may be impaired.  The increased risk of fetal aneuploidy due to advanced maternal age was discussed.   Due to advanced maternal age, the patient was offered and declined an amniocentesis today for definitive diagnosis of fetal aneuploidy.  She is comfortable with the normal views of the fetal anatomy obtained today and her negative cell free DNA test.  Due to advanced maternal age, follow-up growth scan was scheduled in 5 weeks.    The patient stated that all of her questions were answered today.  A total of 30 minutes was spent counseling and coordinating the care for this patient.  Greater than 50% of the time was spent in direct face-to-face contact.

## 2023-05-19 ENCOUNTER — Other Ambulatory Visit (HOSPITAL_COMMUNITY)
Admission: RE | Admit: 2023-05-19 | Discharge: 2023-05-19 | Disposition: A | Discharge status: Home / Self Care | Payer: 59 | Source: Ambulatory Visit | Attending: Obstetrics & Gynecology | Admitting: Obstetrics & Gynecology

## 2023-05-19 ENCOUNTER — Ambulatory Visit: Payer: 59 | Admitting: Obstetrics & Gynecology

## 2023-05-19 ENCOUNTER — Other Ambulatory Visit: Payer: 59

## 2023-05-19 VITALS — BP 125/77 | HR 89 | Wt 272.6 lb

## 2023-05-19 DIAGNOSIS — O99210 Obesity complicating pregnancy, unspecified trimester: Secondary | ICD-10-CM

## 2023-05-19 DIAGNOSIS — Z3403 Encounter for supervision of normal first pregnancy, third trimester: Secondary | ICD-10-CM | POA: Insufficient documentation

## 2023-05-19 DIAGNOSIS — Z3A31 31 weeks gestation of pregnancy: Secondary | ICD-10-CM | POA: Insufficient documentation

## 2023-05-19 DIAGNOSIS — O09522 Supervision of elderly multigravida, second trimester: Secondary | ICD-10-CM

## 2023-05-19 DIAGNOSIS — Z3480 Encounter for supervision of other normal pregnancy, unspecified trimester: Secondary | ICD-10-CM

## 2023-05-19 NOTE — Addendum Note (Signed)
 Addended by: Coralyn Derry D on: 05/19/2023 10:47 AM   Modules accepted: Orders

## 2023-05-19 NOTE — Progress Notes (Signed)
   PRENATAL VISIT NOTE  Subjective:  Erica Wyatt is a 38 y.o. G2P1001 at [redacted]w[redacted]d being seen today for ongoing prenatal care.  She is currently monitored for the following issues for this high-risk pregnancy and has Migraine headache; ADD (attention deficit disorder); Chronic headaches; GERD (gastroesophageal reflux disease); Acute pansinusitis; Asthma; Tubular adenoma of colon; Supervision of other normal pregnancy, antepartum; AMA (advanced maternal age) multigravida 35+; and Obesity affecting pregnancy, antepartum on their problem list.  Patient reports occasional contractions.  Contractions: Not present. Vag. Bleeding: None.  Movement: Present. Denies leaking of fluid.   The following portions of the patient's history were reviewed and updated as appropriate: allergies, current medications, past family history, past medical history, past social history, past surgical history and problem list.   Objective:   Vitals:   05/19/23 0950  BP: 125/77  Pulse: 89  Weight: 272 lb 9.6 oz (123.7 kg)    Fetal Status: Fetal Heart Rate (bpm): 136   Movement: Present     General:  Alert, oriented and cooperative. Patient is in no acute distress.  Skin: Skin is warm and dry. No rash noted.   Cardiovascular: Normal heart rate noted  Respiratory: Normal respiratory effort, no problems with respiration noted  Abdomen: Soft, gravid, appropriate for gestational age.  Pain/Pressure: Present     Pelvic: Cervical exam deferred        Extremities: Normal range of motion.  Edema: None  Mental Status: Normal mood and affect. Normal behavior. Normal judgment and thought content.   Assessment and Plan:  Pregnancy: G2P1001 at [redacted]w[redacted]d 1. Encounter for supervision of normal first pregnancy in third trimester (Primary)  - Glucose Tolerance, 2 Hours w/1 Hour - CBC - HIV Antibody (routine testing w rflx) - RPR  2. [redacted] weeks gestation of pregnancy \ - Glucose Tolerance, 2 Hours w/1 Hour - CBC - HIV Antibody  (routine testing w rflx) - RPR  3. Multigravida of advanced maternal age in second trimester   4. Obesity affecting pregnancy, antepartum, unspecified obesity type   Preterm labor symptoms and general obstetric precautions including but not limited to vaginal bleeding, contractions, leaking of fluid and fetal movement were reviewed in detail with the patient. Please refer to After Visit Summary for other counseling recommendations.   Return in about 2 weeks (around 06/02/2023).  Future Appointments  Date Time Provider Department Center  05/19/2023 10:35 AM Eveline Lynwood MATSU, MD [facility] None  05/24/2023  9:30 AM [facility] US1 [facility]    Lynwood Eveline, MD

## 2023-05-20 LAB — GLUCOSE TOLERANCE, 2 HOURS W/ 1HR
Glucose, 1 hour: 128 mg/dL (ref 70–179)
Glucose, 2 hour: 74 mg/dL (ref 70–152)
Glucose, Fasting: 81 mg/dL (ref 70–91)

## 2023-05-20 LAB — CERVICOVAGINAL ANCILLARY ONLY
Bacterial Vaginitis (gardnerella): NEGATIVE
Candida Glabrata: NEGATIVE
Candida Vaginitis: NEGATIVE
Comment: NEGATIVE
Comment: NEGATIVE
Comment: NEGATIVE

## 2023-05-20 LAB — RPR: RPR Ser Ql: NONREACTIVE

## 2023-05-20 LAB — CBC
Hematocrit: 33.1 % — ABNORMAL LOW (ref 34.0–46.6)
Hemoglobin: 11.3 g/dL (ref 11.1–15.9)
MCH: 30.7 pg (ref 26.6–33.0)
MCHC: 34.1 g/dL (ref 31.5–35.7)
MCV: 90 fL (ref 79–97)
Platelets: 131 10*3/uL — ABNORMAL LOW (ref 150–450)
RBC: 3.68 x10E6/uL — ABNORMAL LOW (ref 3.77–5.28)
RDW: 11.7 % (ref 11.7–15.4)
WBC: 5.1 10*3/uL (ref 3.4–10.8)

## 2023-05-20 LAB — HIV ANTIBODY (ROUTINE TESTING W REFLEX): HIV Screen 4th Generation wRfx: NONREACTIVE

## 2023-05-24 ENCOUNTER — Ambulatory Visit: Payer: 59 | Attending: Obstetrics

## 2023-05-24 ENCOUNTER — Other Ambulatory Visit: Payer: Self-pay

## 2023-05-24 ENCOUNTER — Other Ambulatory Visit: Payer: Self-pay | Admitting: *Deleted

## 2023-05-24 DIAGNOSIS — O09523 Supervision of elderly multigravida, third trimester: Secondary | ICD-10-CM | POA: Diagnosis not present

## 2023-05-24 DIAGNOSIS — Z362 Encounter for other antenatal screening follow-up: Secondary | ICD-10-CM | POA: Insufficient documentation

## 2023-05-24 DIAGNOSIS — Z3A32 32 weeks gestation of pregnancy: Secondary | ICD-10-CM

## 2023-05-24 DIAGNOSIS — E669 Obesity, unspecified: Secondary | ICD-10-CM

## 2023-05-24 DIAGNOSIS — O99213 Obesity complicating pregnancy, third trimester: Secondary | ICD-10-CM | POA: Diagnosis not present

## 2023-05-28 ENCOUNTER — Encounter: Payer: Self-pay | Admitting: Obstetrics & Gynecology

## 2023-06-02 ENCOUNTER — Encounter: Payer: 59 | Admitting: Family Medicine

## 2023-06-02 DIAGNOSIS — Z3480 Encounter for supervision of other normal pregnancy, unspecified trimester: Secondary | ICD-10-CM

## 2023-06-14 ENCOUNTER — Ambulatory Visit (INDEPENDENT_AMBULATORY_CARE_PROVIDER_SITE_OTHER): Payer: 59 | Admitting: Obstetrics & Gynecology

## 2023-06-14 VITALS — BP 118/79 | HR 89 | Wt 272.0 lb

## 2023-06-14 DIAGNOSIS — O09523 Supervision of elderly multigravida, third trimester: Secondary | ICD-10-CM | POA: Diagnosis not present

## 2023-06-14 DIAGNOSIS — O99210 Obesity complicating pregnancy, unspecified trimester: Secondary | ICD-10-CM | POA: Diagnosis not present

## 2023-06-14 DIAGNOSIS — Z3480 Encounter for supervision of other normal pregnancy, unspecified trimester: Secondary | ICD-10-CM | POA: Diagnosis not present

## 2023-06-14 NOTE — Progress Notes (Signed)
   PRENATAL VISIT NOTE  Subjective:  Erica Wyatt is a 38 y.o. G2P1001 at [redacted]w[redacted]d being seen today for ongoing prenatal care.  She is currently monitored for the following issues for this low-risk pregnancy and has Migraine headache; ADD (attention deficit disorder); Chronic headaches; GERD (gastroesophageal reflux disease); Acute pansinusitis; Asthma; Tubular adenoma of colon; Supervision of other normal pregnancy, antepartum; AMA (advanced maternal age) multigravida 35+; and Obesity affecting pregnancy, antepartum on their problem list.  Patient reports  continued reflux despite treatment .  Contractions: Not present. Vag. Bleeding: None.  Movement: Present. Denies leaking of fluid.   The following portions of the patient's history were reviewed and updated as appropriate: allergies, current medications, past family history, past medical history, past social history, past surgical history and problem list.   Objective:   Vitals:   06/14/23 0930  BP: 118/79  Pulse: 89  Weight: 272 lb (123.4 kg)    Fetal Status: Fetal Heart Rate (bpm): 134 Fundal Height: 35 cm Movement: Present  Presentation: Vertex  General:  Alert, oriented and cooperative. Patient is in no acute distress.  Skin: Skin is warm and dry. No rash noted.   Cardiovascular: Normal heart rate noted  Respiratory: Normal respiratory effort, no problems with respiration noted  Abdomen: Soft, gravid, appropriate for gestational age.  Pain/Pressure: Absent     Pelvic: Cervical exam deferred        Extremities: Normal range of motion.     Mental Status: Normal mood and affect. Normal behavior. Normal judgment and thought content.   Assessment and Plan:  Pregnancy: G2P1001 at [redacted]w[redacted]d 1. Supervision of other normal pregnancy, antepartum (Primary)   2. Multigravida of advanced maternal age in third trimester - low risk NIPS  3. Obesity affecting pregnancy, antepartum, unspecified obesity type - excellent weight gain - follow  up MFM scan scheduled  Preterm labor symptoms and general obstetric precautions including but not limited to vaginal bleeding, contractions, leaking of fluid and fetal movement were reviewed in detail with the patient. Please refer to After Visit Summary for other counseling recommendations.   Return in about 1 week (around 06/21/2023) for gbs at next visit.  Future Appointments  Date Time Provider Department Center  06/28/2023  9:15 AM [facility] NURSE [facility]  06/28/2023  9:30 AM [facility] US3 [facility]    Allie Bossier, MD

## 2023-06-23 ENCOUNTER — Other Ambulatory Visit (HOSPITAL_COMMUNITY)
Admission: RE | Admit: 2023-06-23 | Discharge: 2023-06-23 | Disposition: A | Discharge status: Home / Self Care | Source: Ambulatory Visit | Attending: Obstetrics and Gynecology | Admitting: Obstetrics and Gynecology

## 2023-06-23 ENCOUNTER — Ambulatory Visit (INDEPENDENT_AMBULATORY_CARE_PROVIDER_SITE_OTHER): Admitting: Obstetrics and Gynecology

## 2023-06-23 ENCOUNTER — Encounter: Admitting: Obstetrics and Gynecology

## 2023-06-23 VITALS — BP 121/76 | HR 85 | Wt 275.2 lb

## 2023-06-23 DIAGNOSIS — Z113 Encounter for screening for infections with a predominantly sexual mode of transmission: Secondary | ICD-10-CM | POA: Diagnosis present

## 2023-06-23 DIAGNOSIS — Z3A36 36 weeks gestation of pregnancy: Secondary | ICD-10-CM | POA: Insufficient documentation

## 2023-06-23 DIAGNOSIS — O09523 Supervision of elderly multigravida, third trimester: Secondary | ICD-10-CM | POA: Diagnosis not present

## 2023-06-23 DIAGNOSIS — Z3480 Encounter for supervision of other normal pregnancy, unspecified trimester: Secondary | ICD-10-CM

## 2023-06-23 NOTE — Progress Notes (Signed)
 Pt presents for ROB visit. No concerns

## 2023-06-23 NOTE — Patient Instructions (Signed)
 Things to Try After 37 weeks to Encourage Labor/Get Ready for Labor:    Try the [employer] at [email or web address].[email or web address] daily to improve baby's position and encourage the onset of labor.  Walk a little and rest a little every day.  Change positions often.  Cervical Ripening: May try one or both Red Raspberry Leaf capsules or tea:  two 300mg  or 400mg  tablets with each meal, 2-3 times a day, or 1-3 cups of tea daily  Potential Side Effects Of Raspberry Leaf:  Most women do not experience any side effects from drinking raspberry leaf tea. However, nausea and loose stools are possible   Evening Primrose Oil capsules: take 1 capsule by mouth and place one capsule in the vagina every night.    Some of the potential side effects:  Upset stomach  Loose stools or diarrhea  Headaches  Nausea  Sex can also help the cervix ripen and encourage labor onset.

## 2023-06-23 NOTE — Progress Notes (Signed)
   PRENATAL VISIT NOTE  Subjective:  Erica Wyatt is a 38 y.o. G2P1001 at [redacted]w[redacted]d being seen today for ongoing prenatal care.  She is currently monitored for the following issues for this low-risk pregnancy and has Migraine headache; ADD (attention deficit disorder); Chronic headaches; GERD (gastroesophageal reflux disease); Acute pansinusitis; Asthma; Tubular adenoma of colon; Supervision of other normal pregnancy, antepartum; AMA (advanced maternal age) multigravida 35+; and Obesity affecting pregnancy, antepartum on their problem list.  Patient reports no complaints.  Contractions: Not present. Vag. Bleeding: None.  Movement: Present. Denies leaking of fluid.   The following portions of the patient's history were reviewed and updated as appropriate: allergies, current medications, past family history, past medical history, past social history, past surgical history and problem list.   Objective:   Vitals:   06/23/23 0947  BP: 121/76  Pulse: 85  Weight: 275 lb 3.2 oz (124.8 kg)    Fetal Status: Fetal Heart Rate (bpm): 145 Fundal Height: 36 cm Movement: Present  Presentation: Vertex  General:  Alert, oriented and cooperative. Patient is in no acute distress.  Skin: Skin is warm and dry. No rash noted.   Cardiovascular: Normal heart rate noted  Respiratory: Normal respiratory effort, no problems with respiration noted  Abdomen: Soft, gravid, appropriate for gestational age.  Pain/Pressure: Present     Pelvic: Cervical exam deferred        Extremities: Normal range of motion.  Edema: None  Mental Status: Normal mood and affect. Normal behavior. Normal judgment and thought content.   Assessment and Plan:  Pregnancy: G2P1001 at [redacted]w[redacted]d 1. Supervision of other normal pregnancy, antepartum (Primary) BP and FHR normal Doing well, feeling regular movement    2. Multigravida of advanced maternal age in third trimester Follow up u/s  3/17  3. [redacted] weeks gestation of pregnancy Swabs  collected today    Preterm labor symptoms and general obstetric precautions including but not limited to vaginal bleeding, contractions, leaking of fluid and fetal movement were reviewed in detail with the patient. Please refer to After Visit Summary for other counseling recommendations.   Return in one week for routine prenatal   Future Appointments  Date Time Provider Department Center  06/28/2023  8:55 AM Lennart Pall, MD [facility] None  06/28/2023  9:15 AM [facility] NURSE [facility]  06/28/2023  9:30 AM [facility] US3 [facility]  07/05/2023  9:35 AM Sue Lush, FNP [facility] None    Albertine Grates, FNP

## 2023-06-24 LAB — CERVICOVAGINAL ANCILLARY ONLY
Bacterial Vaginitis (gardnerella): NEGATIVE
Candida Glabrata: NEGATIVE
Candida Vaginitis: NEGATIVE
Chlamydia: NEGATIVE
Comment: NEGATIVE
Comment: NEGATIVE
Comment: NEGATIVE
Comment: NEGATIVE
Comment: NEGATIVE
Comment: NORMAL
Neisseria Gonorrhea: NEGATIVE
Trichomonas: NEGATIVE

## 2023-06-28 ENCOUNTER — Ambulatory Visit: Payer: 59

## 2023-06-28 ENCOUNTER — Ambulatory Visit: Payer: 59 | Attending: Maternal & Fetal Medicine

## 2023-06-28 ENCOUNTER — Encounter: Admitting: Obstetrics and Gynecology

## 2023-06-28 VITALS — BP 126/76 | HR 96

## 2023-06-28 DIAGNOSIS — E669 Obesity, unspecified: Secondary | ICD-10-CM

## 2023-06-28 DIAGNOSIS — Z3A37 37 weeks gestation of pregnancy: Secondary | ICD-10-CM | POA: Insufficient documentation

## 2023-06-28 DIAGNOSIS — Z362 Encounter for other antenatal screening follow-up: Secondary | ICD-10-CM | POA: Insufficient documentation

## 2023-06-28 DIAGNOSIS — O99213 Obesity complicating pregnancy, third trimester: Secondary | ICD-10-CM | POA: Diagnosis present

## 2023-06-28 DIAGNOSIS — O09523 Supervision of elderly multigravida, third trimester: Secondary | ICD-10-CM | POA: Diagnosis present

## 2023-06-28 DIAGNOSIS — Z3480 Encounter for supervision of other normal pregnancy, unspecified trimester: Secondary | ICD-10-CM

## 2023-06-28 LAB — STREP GP B CULTURE+RFLX: Strep Gp B Culture+Rflx: POSITIVE — AB

## 2023-06-28 LAB — STREP GP B SUSCEPTIBILITY

## 2023-06-30 ENCOUNTER — Ambulatory Visit: Admitting: Obstetrics & Gynecology

## 2023-06-30 VITALS — BP 117/81 | HR 111 | Wt 277.0 lb

## 2023-06-30 DIAGNOSIS — Z3A37 37 weeks gestation of pregnancy: Secondary | ICD-10-CM | POA: Diagnosis not present

## 2023-06-30 DIAGNOSIS — Z3480 Encounter for supervision of other normal pregnancy, unspecified trimester: Secondary | ICD-10-CM

## 2023-06-30 DIAGNOSIS — O99820 Streptococcus B carrier state complicating pregnancy: Secondary | ICD-10-CM

## 2023-06-30 NOTE — Progress Notes (Signed)
   PRENATAL VISIT NOTE  Subjective:  Erica Wyatt is a 38 y.o. G2P1001 at [redacted]w[redacted]d being seen today for ongoing prenatal care.  She is currently monitored for the following issues for this high-risk pregnancy and has Migraine headache; ADD (attention deficit disorder); Chronic headaches; GERD (gastroesophageal reflux disease); Acute pansinusitis; Asthma; Tubular adenoma of colon; Supervision of other normal pregnancy, antepartum; AMA (advanced maternal age) multigravida 35+; and Obesity affecting pregnancy, antepartum on their problem list.  Patient reports no complaints.  Contractions: Not present. Vag. Bleeding: None.  Movement: Present. Denies leaking of fluid.   The following portions of the patient's history were reviewed and updated as appropriate: allergies, current medications, past family history, past medical history, past social history, past surgical history and problem list.   Objective:   Vitals:   06/30/23 0905  BP: 117/81  Pulse: (!) 111  Weight: 277 lb (125.6 kg)    Fetal Status: Fetal Heart Rate (bpm): 139   Movement: Present     General:  Alert, oriented and cooperative. Patient is in no acute distress.  Skin: Skin is warm and dry. No rash noted.   Cardiovascular: Normal heart rate noted  Respiratory: Normal respiratory effort, no problems with respiration noted  Abdomen: Soft, gravid, appropriate for gestational age.  Pain/Pressure: Absent     Pelvic: Cervical exam deferred        Extremities: Normal range of motion.  Edema: None  Mental Status: Normal mood and affect. Normal behavior. Normal judgment and thought content.   Assessment and Plan:  Pregnancy: G2P1001 at [redacted]w[redacted]d 1. Supervision of other normal pregnancy, antepartum (Primary)   2. GBS (group B Streptococcus carrier), +RV culture, currently pregnant   3. [redacted] weeks gestation of pregnancy   Preterm labor symptoms and general obstetric precautions including but not limited to vaginal bleeding,  contractions, leaking of fluid and fetal movement were reviewed in detail with the patient. Please refer to After Visit Summary for other counseling recommendations.   Return in about 1 week (around 07/07/2023).  Future Appointments  Date Time Provider Department Center  07/05/2023  9:35 AM Sue Lush, FNP [facility] None    Scheryl Darter, MD

## 2023-07-05 ENCOUNTER — Ambulatory Visit (INDEPENDENT_AMBULATORY_CARE_PROVIDER_SITE_OTHER): Admitting: Obstetrics and Gynecology

## 2023-07-05 VITALS — BP 115/81 | HR 127 | Wt 278.5 lb

## 2023-07-05 DIAGNOSIS — Z3480 Encounter for supervision of other normal pregnancy, unspecified trimester: Secondary | ICD-10-CM | POA: Diagnosis not present

## 2023-07-05 DIAGNOSIS — Z3A38 38 weeks gestation of pregnancy: Secondary | ICD-10-CM | POA: Diagnosis not present

## 2023-07-05 NOTE — Progress Notes (Signed)
 No c/o today

## 2023-07-05 NOTE — Progress Notes (Signed)
   PRENATAL VISIT NOTE  Subjective:  Erica Wyatt is a 38 y.o. G2P1001 at [redacted]w[redacted]d being seen today for ongoing prenatal care.  She is currently monitored for the following issues for this high-risk pregnancy and has Migraine headache; ADD (attention deficit disorder); Chronic headaches; GERD (gastroesophageal reflux disease); Acute pansinusitis; Asthma; Tubular adenoma of colon; Supervision of other normal pregnancy, antepartum; AMA (advanced maternal age) multigravida 35+; and Obesity affecting pregnancy, antepartum on their problem list.  Patient reports no complaints.  Contractions: Not present. Vag. Bleeding: None.  Movement: Present. Denies leaking of fluid.   The following portions of the patient's history were reviewed and updated as appropriate: allergies, current medications, past family history, past medical history, past social history, past surgical history and problem list.   Objective:   Vitals:   07/05/23 0948  BP: 115/81  Pulse: (!) 127  Weight: 126.3 kg    Fetal Status: Fetal Heart Rate (bpm): 120   Movement: Present     General:  Alert, oriented and cooperative. Patient is in no acute distress.  Skin: Skin is warm and dry. No rash noted.   Cardiovascular: Normal heart rate noted  Respiratory: Normal respiratory effort, no problems with respiration noted  Abdomen: Soft, gravid, appropriate for gestational age.  Pain/Pressure: Absent     Pelvic: Cervical exam deferred        Extremities: Normal range of motion.  Edema: None  Mental Status: Normal mood and affect. Normal behavior. Normal judgment and thought content.   Assessment and Plan:  Pregnancy: G2P1001 at [redacted]w[redacted]d 1. [redacted] weeks gestation of pregnancy   2. Supervision of other normal pregnancy, antepartum   Patient plans to breastfeed. I spoke with patient at length about what to expect and answered all questions regarding this topic.  Patient is undecided about birth control at this time due to advanced age and  considering having another baby. I discussed with patient recommendation to wait at least 18 months before becoming pregnant again.  The support person present was interested in vasectomy, I provided information regarding the Vasectomy clinic at the [facility]. Term labor symptoms and general obstetric precautions including but not limited to vaginal bleeding, contractions, leaking of fluid and fetal movement were reviewed in detail with the patient. Please refer to After Visit Summary for other counseling recommendations.    Return in about 1 week (around 07/12/2023) for OB VISIT (MD or APP). Future Appointments  Date Time Provider Department Center  07/12/2023  9:35 AM Constant, Gigi Gin, MD [facility] None     Zenia Resides, Student-NP

## 2023-07-12 ENCOUNTER — Encounter (HOSPITAL_COMMUNITY): Payer: Self-pay | Admitting: *Deleted

## 2023-07-12 ENCOUNTER — Telehealth (HOSPITAL_COMMUNITY): Payer: Self-pay | Admitting: *Deleted

## 2023-07-12 ENCOUNTER — Ambulatory Visit (INDEPENDENT_AMBULATORY_CARE_PROVIDER_SITE_OTHER): Admitting: Obstetrics and Gynecology

## 2023-07-12 VITALS — BP 111/72 | HR 86 | Wt 277.0 lb

## 2023-07-12 DIAGNOSIS — O99210 Obesity complicating pregnancy, unspecified trimester: Secondary | ICD-10-CM | POA: Diagnosis not present

## 2023-07-12 DIAGNOSIS — O09523 Supervision of elderly multigravida, third trimester: Secondary | ICD-10-CM

## 2023-07-12 DIAGNOSIS — Z3480 Encounter for supervision of other normal pregnancy, unspecified trimester: Secondary | ICD-10-CM

## 2023-07-12 NOTE — Progress Notes (Signed)
   PRENATAL VISIT NOTE  Subjective:  Erica Wyatt is a 38 y.o. G2P1001 at [redacted]w[redacted]d being seen today for ongoing prenatal care.  She is currently monitored for the following issues for this high-risk pregnancy and has Migraine headache; ADD (attention deficit disorder); Chronic headaches; GERD (gastroesophageal reflux disease); Acute pansinusitis; Asthma; Tubular adenoma of colon; Supervision of other normal pregnancy, antepartum; AMA (advanced maternal age) multigravida 35+; and Obesity affecting pregnancy, antepartum on their problem list.  Patient reports no complaints.  Contractions: Not present. Vag. Bleeding: None.  Movement: Present. Denies leaking of fluid.   The following portions of the patient's history were reviewed and updated as appropriate: allergies, current medications, past family history, past medical history, past social history, past surgical history and problem list.   Objective:   Vitals:   07/12/23 0944  BP: 111/72  Pulse: 86  Weight: 277 lb (125.6 kg)    Fetal Status:   Fundal Height: 39 cm Movement: Present     General:  Alert, oriented and cooperative. Patient is in no acute distress.  Skin: Skin is warm and dry. No rash noted.   Cardiovascular: Normal heart rate noted  Respiratory: Normal respiratory effort, no problems with respiration noted  Abdomen: Soft, gravid, appropriate for gestational age.  Pain/Pressure: Present     Pelvic: Cervical exam performed in the presence of a chaperone Dilation: Fingertip Effacement (%): 30 Station: Ballotable  Extremities: Normal range of motion.     Mental Status: Normal mood and affect. Normal behavior. Normal judgment and thought content.   Assessment and Plan:  Pregnancy: G2P1001 at [redacted]w[redacted]d 1. Supervision of other normal pregnancy, antepartum (Primary) Patient is doing well without complaints  2. Obesity affecting pregnancy, antepartum, unspecified obesity type Plan for IOL at 40 weeks per MFM  3. Multigravida of  advanced maternal age in third trimester   Term labor symptoms and general obstetric precautions including but not limited to vaginal bleeding, contractions, leaking of fluid and fetal movement were reviewed in detail with the patient. Please refer to After Visit Summary for other counseling recommendations.   No follow-ups on file.  Future Appointments  Date Time Provider Department Center  07/26/2023  6:30 AM [facility] SCHED ROOM [facility] None    Catalina Antigua, MD

## 2023-07-12 NOTE — Telephone Encounter (Signed)
 Preadmission screen

## 2023-07-19 ENCOUNTER — Encounter: Payer: Self-pay | Admitting: Obstetrics and Gynecology

## 2023-07-19 ENCOUNTER — Ambulatory Visit (INDEPENDENT_AMBULATORY_CARE_PROVIDER_SITE_OTHER): Admitting: Obstetrics and Gynecology

## 2023-07-19 VITALS — BP 113/76 | HR 76 | Wt 275.0 lb

## 2023-07-19 DIAGNOSIS — O09523 Supervision of elderly multigravida, third trimester: Secondary | ICD-10-CM

## 2023-07-19 DIAGNOSIS — Z3480 Encounter for supervision of other normal pregnancy, unspecified trimester: Secondary | ICD-10-CM | POA: Diagnosis not present

## 2023-07-19 DIAGNOSIS — O99210 Obesity complicating pregnancy, unspecified trimester: Secondary | ICD-10-CM

## 2023-07-19 NOTE — Progress Notes (Signed)
   PRENATAL VISIT NOTE  Subjective:  Erica Wyatt is a 38 y.o. G2P1001 at [redacted]w[redacted]d being seen today for ongoing prenatal care.  She is currently monitored for the following issues for this high-risk pregnancy and has Migraine headache; ADD (attention deficit disorder); Chronic headaches; GERD (gastroesophageal reflux disease); Acute pansinusitis; Asthma; Tubular adenoma of colon; Supervision of other normal pregnancy, antepartum; AMA (advanced maternal age) multigravida 35+; and Obesity affecting pregnancy, antepartum on their problem list.  Patient reports contractions since this am .  Contractions: Irregular. Vag. Bleeding: None.  Movement: Present. Denies leaking of fluid.   The following portions of the patient's history were reviewed and updated as appropriate: allergies, current medications, past family history, past medical history, past social history, past surgical history and problem list.   Objective:   Vitals:   07/19/23 1556  BP: 113/76  Pulse: 76  Weight: 275 lb (124.7 kg)    Fetal Status: Fetal Heart Rate (bpm): 142 Fundal Height: 40 cm Movement: Present     General:  Alert, oriented and cooperative. Patient is in no acute distress.  Skin: Skin is warm and dry. No rash noted.   Cardiovascular: Normal heart rate noted  Respiratory: Normal respiratory effort, no problems with respiration noted  Abdomen: Soft, gravid, appropriate for gestational age.  Pain/Pressure: Present     Pelvic: Cervical exam performed in the presence of a chaperone Dilation: 2 Effacement (%): 50 Station: -3  Extremities: Normal range of motion.  Edema: None  Mental Status: Normal mood and affect. Normal behavior. Normal judgment and thought content.   Assessment and Plan:  Pregnancy: G2P1001 at [redacted]w[redacted]d 1. Supervision of other normal pregnancy, antepartum (Primary) Patient is doing well reporting irregular contractions since this morning  2. Multigravida of advanced maternal age in third  trimester   3. Obesity affecting pregnancy, antepartum, unspecified obesity type Patient decline IOL this morning when L&D called her. Plan for postdate testing on Thursday and IOL on 4/14 if undelivered  Term labor symptoms and general obstetric precautions including but not limited to vaginal bleeding, contractions, leaking of fluid and fetal movement were reviewed in detail with the patient. Please refer to After Visit Summary for other counseling recommendations.   Return in about 4 days (around 07/23/2023) for NST ad AFI.  Future Appointments  Date Time Provider Department Center  07/26/2023  6:30 AM [facility] SCHED ROOM [facility] None    Catalina Antigua, MD

## 2023-07-19 NOTE — Progress Notes (Signed)
 ROB, c/o irregular contractions 15-30 minutes apart.

## 2023-07-20 ENCOUNTER — Inpatient Hospital Stay (HOSPITAL_COMMUNITY): Admitting: Anesthesiology

## 2023-07-20 ENCOUNTER — Encounter (HOSPITAL_COMMUNITY): Payer: Self-pay | Admitting: Obstetrics and Gynecology

## 2023-07-20 ENCOUNTER — Inpatient Hospital Stay (HOSPITAL_COMMUNITY)
Admission: AD | Admit: 2023-07-20 | Discharge: 2023-07-22 | DRG: 807 | Disposition: A | Discharge status: Home / Self Care | Attending: Obstetrics and Gynecology | Admitting: Obstetrics and Gynecology

## 2023-07-20 DIAGNOSIS — Z880 Allergy status to penicillin: Secondary | ICD-10-CM

## 2023-07-20 DIAGNOSIS — J45909 Unspecified asthma, uncomplicated: Secondary | ICD-10-CM | POA: Diagnosis present

## 2023-07-20 DIAGNOSIS — Z3A40 40 weeks gestation of pregnancy: Secondary | ICD-10-CM | POA: Diagnosis not present

## 2023-07-20 DIAGNOSIS — O4202 Full-term premature rupture of membranes, onset of labor within 24 hours of rupture: Secondary | ICD-10-CM | POA: Diagnosis not present

## 2023-07-20 DIAGNOSIS — O26893 Other specified pregnancy related conditions, third trimester: Secondary | ICD-10-CM | POA: Diagnosis present

## 2023-07-20 DIAGNOSIS — O99214 Obesity complicating childbirth: Secondary | ICD-10-CM | POA: Diagnosis present

## 2023-07-20 DIAGNOSIS — O99824 Streptococcus B carrier state complicating childbirth: Secondary | ICD-10-CM | POA: Diagnosis present

## 2023-07-20 DIAGNOSIS — O9962 Diseases of the digestive system complicating childbirth: Principal | ICD-10-CM | POA: Diagnosis present

## 2023-07-20 DIAGNOSIS — Z3480 Encounter for supervision of other normal pregnancy, unspecified trimester: Secondary | ICD-10-CM

## 2023-07-20 DIAGNOSIS — O480 Post-term pregnancy: Secondary | ICD-10-CM | POA: Diagnosis not present

## 2023-07-20 DIAGNOSIS — O9952 Diseases of the respiratory system complicating childbirth: Secondary | ICD-10-CM | POA: Diagnosis present

## 2023-07-20 DIAGNOSIS — O99820 Streptococcus B carrier state complicating pregnancy: Secondary | ICD-10-CM | POA: Diagnosis not present

## 2023-07-20 DIAGNOSIS — Z560 Unemployment, unspecified: Secondary | ICD-10-CM | POA: Diagnosis not present

## 2023-07-20 DIAGNOSIS — Z91040 Latex allergy status: Secondary | ICD-10-CM | POA: Diagnosis not present

## 2023-07-20 DIAGNOSIS — F988 Other specified behavioral and emotional disorders with onset usually occurring in childhood and adolescence: Secondary | ICD-10-CM | POA: Diagnosis present

## 2023-07-20 DIAGNOSIS — O09529 Supervision of elderly multigravida, unspecified trimester: Secondary | ICD-10-CM

## 2023-07-20 DIAGNOSIS — O99210 Obesity complicating pregnancy, unspecified trimester: Secondary | ICD-10-CM | POA: Diagnosis present

## 2023-07-20 DIAGNOSIS — K219 Gastro-esophageal reflux disease without esophagitis: Secondary | ICD-10-CM | POA: Diagnosis present

## 2023-07-20 DIAGNOSIS — Z882 Allergy status to sulfonamides status: Secondary | ICD-10-CM

## 2023-07-20 DIAGNOSIS — D126 Benign neoplasm of colon, unspecified: Secondary | ICD-10-CM | POA: Diagnosis present

## 2023-07-20 DIAGNOSIS — G43909 Migraine, unspecified, not intractable, without status migrainosus: Secondary | ICD-10-CM | POA: Diagnosis present

## 2023-07-20 DIAGNOSIS — O09523 Supervision of elderly multigravida, third trimester: Secondary | ICD-10-CM | POA: Diagnosis not present

## 2023-07-20 DIAGNOSIS — Z3490 Encounter for supervision of normal pregnancy, unspecified, unspecified trimester: Principal | ICD-10-CM | POA: Diagnosis present

## 2023-07-20 LAB — CBC
HCT: 30.6 % — ABNORMAL LOW (ref 36.0–46.0)
Hemoglobin: 10.2 g/dL — ABNORMAL LOW (ref 12.0–15.0)
MCH: 29.6 pg (ref 26.0–34.0)
MCHC: 33.3 g/dL (ref 30.0–36.0)
MCV: 88.7 fL (ref 80.0–100.0)
Platelets: 119 10*3/uL — ABNORMAL LOW (ref 150–400)
RBC: 3.45 MIL/uL — ABNORMAL LOW (ref 3.87–5.11)
RDW: 12.4 % (ref 11.5–15.5)
WBC: 9.2 10*3/uL (ref 4.0–10.5)
nRBC: 0 % (ref 0.0–0.2)

## 2023-07-20 LAB — TYPE AND SCREEN
ABO/RH(D): A POS
Antibody Screen: NEGATIVE

## 2023-07-20 LAB — POCT FERN TEST: POCT Fern Test: POSITIVE

## 2023-07-20 MED ORDER — FAMOTIDINE IN NACL 20-0.9 MG/50ML-% IV SOLN
20.0000 mg | Freq: Once | INTRAVENOUS | Status: AC
  Administered 2023-07-20: 20 mg via INTRAVENOUS
  Filled 2023-07-20: qty 50

## 2023-07-20 MED ORDER — FENTANYL-BUPIVACAINE-NACL 0.5-0.125-0.9 MG/250ML-% EP SOLN
12.0000 mL/h | EPIDURAL | Status: DC | PRN
  Administered 2023-07-20: 12 mL/h via EPIDURAL
  Filled 2023-07-20: qty 250

## 2023-07-20 MED ORDER — OXYCODONE-ACETAMINOPHEN 5-325 MG PO TABS: 2.0000 | ORAL_TABLET | ORAL | Status: DC | PRN

## 2023-07-20 MED ORDER — CEFAZOLIN SODIUM-DEXTROSE 1-4 GM/50ML-% IV SOLN
1.0000 g | Freq: Three times a day (TID) | INTRAVENOUS | Status: DC
  Filled 2023-07-20: qty 50

## 2023-07-20 MED ORDER — DIPHENHYDRAMINE HCL 50 MG/ML IJ SOLN: 12.5000 mg | INTRAMUSCULAR | Status: DC | PRN

## 2023-07-20 MED ORDER — CEFAZOLIN SODIUM-DEXTROSE 2-4 GM/100ML-% IV SOLN
2.0000 g | Freq: Once | INTRAVENOUS | Status: AC
  Administered 2023-07-20: 2 g via INTRAVENOUS
  Filled 2023-07-20: qty 100

## 2023-07-20 MED ORDER — CALCIUM CARBONATE ANTACID 500 MG PO CHEW: 400.0000 mg | CHEWABLE_TABLET | Freq: Four times a day (QID) | ORAL | Status: DC | PRN

## 2023-07-20 MED ORDER — OXYCODONE-ACETAMINOPHEN 5-325 MG PO TABS: 1.0000 | ORAL_TABLET | ORAL | Status: DC | PRN

## 2023-07-20 MED ORDER — ACETAMINOPHEN 325 MG PO TABS: 650.0000 mg | ORAL_TABLET | ORAL | Status: DC | PRN

## 2023-07-20 MED ORDER — PHENYLEPHRINE 80 MCG/ML (10ML) SYRINGE FOR IV PUSH (FOR BLOOD PRESSURE SUPPORT): 80.0000 ug | PREFILLED_SYRINGE | INTRAVENOUS | Status: DC | PRN

## 2023-07-20 MED ORDER — EPHEDRINE 5 MG/ML INJ: 10.0000 mg | INTRAVENOUS | Status: DC | PRN

## 2023-07-20 MED ORDER — FAMOTIDINE 20 MG PO TABS
20.0000 mg | ORAL_TABLET | Freq: Two times a day (BID) | ORAL | Status: DC
  Administered 2023-07-20: 20 mg via ORAL
  Filled 2023-07-20: qty 1

## 2023-07-20 MED ORDER — SOD CITRATE-CITRIC ACID 500-334 MG/5ML PO SOLN
30.0000 mL | ORAL | Status: DC | PRN
  Administered 2023-07-20: 30 mL via ORAL
  Filled 2023-07-20: qty 30

## 2023-07-20 MED ORDER — ONDANSETRON HCL 4 MG/2ML IJ SOLN
4.0000 mg | Freq: Four times a day (QID) | INTRAMUSCULAR | Status: DC | PRN
  Administered 2023-07-20: 4 mg via INTRAVENOUS
  Filled 2023-07-20: qty 2

## 2023-07-20 MED ORDER — LACTATED RINGERS IV SOLN: INTRAVENOUS | Status: DC

## 2023-07-20 MED ORDER — LACTATED RINGERS IV SOLN
500.0000 mL | Freq: Once | INTRAVENOUS | Status: AC
  Administered 2023-07-20: 500 mL via INTRAVENOUS

## 2023-07-20 MED ORDER — LACTATED RINGERS IV SOLN: 500.0000 mL | INTRAVENOUS | Status: DC | PRN

## 2023-07-20 MED ORDER — LIDOCAINE HCL (PF) 1 % IJ SOLN
INTRAMUSCULAR | Status: DC | PRN
  Administered 2023-07-20: 10 mL via EPIDURAL

## 2023-07-20 MED ORDER — OXYTOCIN-SODIUM CHLORIDE 30-0.9 UT/500ML-% IV SOLN
2.5000 [IU]/h | INTRAVENOUS | Status: DC
  Administered 2023-07-21: 2.5 [IU]/h via INTRAVENOUS
  Filled 2023-07-20: qty 500

## 2023-07-20 MED ORDER — OXYTOCIN BOLUS FROM INFUSION
333.0000 mL | Freq: Once | INTRAVENOUS | Status: AC
  Administered 2023-07-21: 333 mL via INTRAVENOUS

## 2023-07-20 MED ORDER — LIDOCAINE HCL (PF) 1 % IJ SOLN: 30.0000 mL | INTRAMUSCULAR | Status: DC | PRN

## 2023-07-20 NOTE — MAU Note (Addendum)
 Pt informed that the ultrasound is considered a limited OB ultrasound and is not intended to be a complete ultrasound exam.  Patient also informed that the ultrasound is not being completed with the intent of assessing for fetal or placental anomalies or any pelvic abnormalities.  Explained that the purpose of today's ultrasound is to assess for  presentation.  Patient acknowledges the purpose of the exam and the limitations of the study.     Verified by Marcell Barlow, NP

## 2023-07-20 NOTE — Anesthesia Procedure Notes (Signed)
 Epidural Patient location during procedure: OB Start time: 07/20/2023 9:55 PM End time: 07/20/2023 10:05 PM  Staffing Anesthesiologist: Elmer Picker, MD Performed: anesthesiologist   Preanesthetic Checklist Completed: patient identified, IV checked, risks and benefits discussed, monitors and equipment checked, pre-op evaluation and timeout performed  Epidural Patient position: sitting Prep: DuraPrep and site prepped and draped Patient monitoring: continuous pulse ox, blood pressure, heart rate and cardiac monitor Approach: midline Location: L3-L4 Injection technique: LOR air  Needle:  Needle type: Tuohy  Needle gauge: 17 G Needle length: 9 cm Needle insertion depth: 6 cm Catheter type: closed end flexible Catheter size: 19 Gauge Catheter at skin depth: 11 cm Test dose: negative  Assessment Sensory level: T8 Events: blood not aspirated, no cerebrospinal fluid, injection not painful, no injection resistance, no paresthesia and negative IV test  Additional Notes Patient identified. Risks/Benefits/Options discussed with patient including but not limited to bleeding, infection, nerve damage, paralysis, failed block, incomplete pain control, headache, blood pressure changes, nausea, vomiting, reactions to medication both or allergic, itching and postpartum back pain. Confirmed with bedside nurse the patient's most recent platelet count. Confirmed with patient that they are not currently taking any anticoagulation, have any bleeding history or any family history of bleeding disorders. Patient expressed understanding and wished to proceed. All questions were answered. Sterile technique was used throughout the entire procedure. Please see nursing notes for vital signs. Test dose was given through epidural catheter and negative prior to continuing to dose epidural or start infusion. Warning signs of high block given to the patient including shortness of breath, tingling/numbness in hands,  complete motor block, or any concerning symptoms with instructions to call for help. Patient was given instructions on fall risk and not to get out of bed. All questions and concerns addressed with instructions to call with any issues or inadequate analgesia.  Reason for block:procedure for pain

## 2023-07-20 NOTE — Anesthesia Preprocedure Evaluation (Signed)
 Anesthesia Evaluation  Patient identified by MRN, date of birth, ID band Patient awake    Reviewed: Allergy & Precautions, NPO status , Patient's Chart, lab work & pertinent test results  History of Anesthesia Complications (+) PONV and history of anesthetic complications  Airway Mallampati: II  TM Distance: >3 FB Neck ROM: Full    Dental no notable dental hx.    Pulmonary asthma    Pulmonary exam normal breath sounds clear to auscultation       Cardiovascular negative cardio ROS Normal cardiovascular exam Rhythm:Regular Rate:Normal     Neuro/Psych  Headaches  negative psych ROS   GI/Hepatic Neg liver ROS,GERD  ,,  Endo/Other  negative endocrine ROS    Renal/GU negative Renal ROS  negative genitourinary   Musculoskeletal negative musculoskeletal ROS (+)    Abdominal   Peds  Hematology negative hematology ROS (+)   Anesthesia Other Findings Presents in labor with SROM  Reproductive/Obstetrics (+) Pregnancy                             Anesthesia Physical Anesthesia Plan  ASA: 2  Anesthesia Plan: Epidural   Post-op Pain Management:    Induction:   PONV Risk Score and Plan: Treatment may vary due to age or medical condition  Airway Management Planned: Natural Airway  Additional Equipment:   Intra-op Plan:   Post-operative Plan:   Informed Consent: I have reviewed the patients History and Physical, chart, labs and discussed the procedure including the risks, benefits and alternatives for the proposed anesthesia with the patient or authorized representative who has indicated his/her understanding and acceptance.       Plan Discussed with: Anesthesiologist  Anesthesia Plan Comments: (Patient identified. Risks, benefits, options discussed with patient including but not limited to bleeding, infection, nerve damage, paralysis, failed block, incomplete pain control, headache,  blood pressure changes, nausea, vomiting, reactions to medication, itching, and post partum back pain. Confirmed with bedside nurse the patient's most recent platelet count. Confirmed with the patient that they are not taking any anticoagulation, have any bleeding history or any family history of bleeding disorders. Patient expressed understanding and wishes to proceed. All questions were answered. )       Anesthesia Quick Evaluation

## 2023-07-20 NOTE — MAU Note (Signed)
.  Erica Wyatt is a 38 y.o. at [redacted]w[redacted]d here in MAU reporting:   Ctxs since this past Sunday but they have been irregular. Today ctxs became more intense, lasting longer and hurting more. No lof, but some bloody show.  Had a membrane sweep in the office. Was 2cm. On 4/7 Pain 7/10 ctxs.   Vitals:   07/20/23 1522  BP: 122/78  Pulse: (!) 103  Resp: 17  Temp: 98.4 F (36.9 C)  SpO2: 99%    FHT: 134  Lab orders placed from triage: labor eval

## 2023-07-20 NOTE — H&P (Cosign Needed Addendum)
 OBSTETRIC ADMISSION HISTORY AND PHYSICAL  TYSHANA NISHIDA is a 38 y.o. female G2P1001 with IUP at [redacted]w[redacted]d by LMP confirmed by [place] presenting for SOL and SROM. She reports +FMs, No LOF, no VB, no blurry vision, headaches or peripheral edema, and RUQ pain.  She plans on breast feeding. She is undecided for birth control. She received her prenatal care at  [facility]    Dating: By LMP, confirmed by [place] --->  Estimated Date of Delivery: 07/19/23  Sono:    @[redacted]w[redacted]d , CWD, normal anatomy, cephalic presentation, posterior lie, 2917g, 39% EFW   Prenatal History/Complications:  -AMA -BMI 33  Past Medical History: Past Medical History:  Diagnosis Date   ACNE, MILD 09/26/2009   Qualifier: Diagnosis of   By: Dayton Martes MD, Talia         Acute strain of neck muscle 03/02/2014   Asthma    exercise inducted   GERD (gastroesophageal reflux disease)    Low back strain 03/02/2014   Migraine headache    Migraines    Otitis media 03/13/2020   PONV (postoperative nausea and vomiting)    Thrombocytopenia complicating pregnancy (HCC)     Past Surgical History: Past Surgical History:  Procedure Laterality Date   COLONOSCOPY  10/01/2022   benign poly removed   FOOT SPLIT TRANSFER OF THE POSTERIOR TIBIALIS TENDON PROCEDURE  10/08/2017   Dr. Ernestene Kiel   removal fatty tumor Left    dermatology, left upper thigh   WISDOM TOOTH EXTRACTION      Obstetrical History: OB History     Gravida  2   Para  1   Term  1   Preterm  0   AB  0   Living  1      SAB  0   IAB  0   Ectopic  0   Multiple  0   Live Births  1           Social History Social History   Socioeconomic History   Marital status: Single    Spouse name: Not on file   Number of children: Not on file   Years of education: Not on file   Highest education level: Not on file  Occupational History   Occupation: Civil engineer, contracting: UNEMPLOYED  Tobacco Use   Smoking status: Never   Smokeless tobacco: Never  Vaping Use    Vaping status: Never Used  Substance and Sexual Activity   Alcohol use: Yes    Alcohol/week: 0.0 standard drinks of alcohol    Comment: socially    Drug use: No   Sexual activity: Yes    Partners: Male    Birth control/protection: Condom  Other Topics Concern   Not on file  Social History Narrative   Not on file   Social Drivers of Health   Financial Resource Strain: Low Risk  (05/25/2022)   Received from [facility], [facility]   Overall Financial Resource Strain (CARDIA)    Difficulty of Paying Living Expenses: Not hard at all  Food Insecurity: No Food Insecurity (05/25/2022)   Received from [facility], [facility]   Hunger Vital Sign    Worried About Running Out of Food in the Last Year: Never true    Ran Out of Food in the Last Year: Never true  Transportation Needs: No Transportation Needs (05/25/2022)   Received from [facility], [facility]   [facility] - Transportation    Lack of Transportation (Medical): No  Lack of Transportation (Non-Medical): No  Physical Activity: Insufficiently Active (05/25/2022)   Received from [facility], [facility]   Exercise Vital Sign    Days of Exercise per Week: 4 days    Minutes of Exercise per Session: 20 min  Stress: No Stress Concern Present (05/25/2022)   Received from [facility], [facility] - Occupational Stress Questionnaire    Feeling of Stress : Not at all  Social Connections: Socially Integrated (05/25/2022)   Received from [facility], [facility]   Social Network    How would you rate your social network (family, work, friends)?: Good participation with social networks    Family History: Family History  Problem Relation Age of Onset   Hypertension Mother    Fibroids Mother    Breast cancer Mother    Hypertension Father    Alzheimer's disease Father    Heart failure Father    Heart disease Maternal Grandmother    Fibroids Maternal Grandmother     Heart disease Maternal Grandfather    Cancer Paternal Grandmother    Heart disease Paternal Grandfather    Anesthesia problems Neg Hx    Hypotension Neg Hx    Malignant hyperthermia Neg Hx    Pseudochol deficiency Neg Hx     Allergies: Allergies  Allergen Reactions   Latex Rash and Other (See Comments)    Irritation to mucous membranes, especially vaginal tissue    Amoxicillin Swelling and Other (See Comments)    Childhood reaction   Penicillins Swelling and Other (See Comments)    Childhood reaction   Topiramate Other (See Comments)    Suicidal thoughts     Sulfa Antibiotics Swelling and Rash    Medications Prior to Admission  Medication Sig Dispense Refill Last Dose/Taking   famotidine (PEPCID) 20 MG tablet Take 1 tablet (20 mg total) by mouth 2 (two) times daily as needed for heartburn or indigestion. 30 tablet 2 07/20/2023   albuterol (PROVENTIL HFA) 108 (90 BASE) MCG/ACT inhaler Inhale 2 puffs into the lungs every 6 (six) hours as needed. Asthma attacks (Patient not taking: Reported on 12/09/2022) 1 Inhaler 0    amphetamine-dextroamphetamine (ADDERALL) 10 MG tablet Take 10 mg by mouth daily as needed (focus). (Patient not taking: Reported on 01/11/2023)      aspirin EC 81 MG tablet Take 1 tablet (81 mg total) by mouth daily. Swallow whole. (Patient not taking: Reported on 07/05/2023) 30 tablet 12    cetirizine (ZYRTEC) 10 MG tablet Take 10 mg by mouth as needed for allergies. (Patient not taking: Reported on 12/09/2022)      clindamycin (CLINDAGEL) 1 % gel Apply 1 Application topically as needed (acne). (Patient not taking: Reported on 11/24/2022)      CONCERTA 54 MG CR tablet Take 54 mg by mouth daily. (Patient not taking: Reported on 01/11/2023)      etonogestrel (NEXPLANON) 68 MG IMPL implant 1 each by Subdermal route once. (Patient not taking: Reported on 01/11/2023)      fluticasone (FLONASE) 50 MCG/ACT nasal spray Place 1 spray into both nostrils as needed for allergies.  (Patient not taking: Reported on 01/11/2023)  0    ibuprofen (ADVIL,MOTRIN) 800 MG tablet Take 1 tablet (800 mg total) by mouth every 8 (eight) hours as needed. (Patient not taking: Reported on 01/11/2023) 30 tablet 5    Lactic Ac-Citric Ac-Pot Bitart (PHEXXI) 1.8-1-0.4 % GEL 1 applicatorful per vagina up to 1 hour before intercourse (Patient not  taking: Reported on 01/11/2023) 25 g 5    meloxicam (MOBIC) 15 MG tablet Take 1 tablet (15 mg total) by mouth daily. (Patient not taking: Reported on 01/11/2023) 30 tablet 3    pantoprazole (PROTONIX) 20 MG tablet Take 1 tablet (20 mg total) by mouth daily. 30 tablet 2    Prenatal Vit-Fe Fumarate-FA (MULTIVITAMIN-PRENATAL) 27-0.8 MG TABS tablet Take 1 tablet by mouth daily at 12 noon. (Patient not taking: Reported on 07/05/2023)      tinidazole (TINDAMAX) 500 MG tablet Take 1 tablet (500 mg total) by mouth 2 (two) times daily. (Patient not taking: Reported on 01/11/2023) 14 tablet 2    tretinoin (RETIN-A) 0.025 % cream Apply 1 application  topically at bedtime. Apply as directed a pea-sized amount evenly to face, avoid nose, eyes, corners of mouth; use strict sunscreen, do not use if pregnant. (Patient not taking: Reported on 11/24/2022)      UBRELVY 100 MG TABS Take 10 mg by mouth daily as needed (migraines). (Patient not taking: Reported on 12/09/2022)        Review of Systems   All systems reviewed and negative except as stated in HPI  Blood pressure 122/78, pulse (!) 103, temperature 98.4 F (36.9 C), temperature source Oral, resp. rate 17, height 6\' 4"  (1.93 m), weight 124.2 kg, last menstrual period 10/12/2022, SpO2 99%. General appearance: alert, cooperative, and no distress Lungs: clear to auscultation bilaterally Heart: regular rate and rhythm Abdomen: soft, non-tender; bowel sounds normal Extremities: Homans sign is negative, no sign of DVT Presentation: cephalic Fetal monitoringBaseline: 130 bpm, Variability: Good {> 6 bpm), and Accelerations:  Reactive Uterine activityDate/time of onset: 07/19/2023 and Frequency: Every 4-5 minutes Dilation: 3 Effacement (%): 60 Station: -3 Exam by:: Felipa Furnace RN   Prenatal labs: ABO, Rh: A/Positive/-- (08/28 1058) Antibody: Negative (08/28 1058) Rubella: 2.00 (08/28 1058) RPR: Non Reactive (02/05 0836)  HBsAg: Negative (08/28 1058)  HIV: Non Reactive (02/05 0836)  GBS: Positive/-- (03/12 1042)    Lab Results  Component Value Date   GBS Positive (A) 06/23/2023   GTT nl early 2 hour, A1c 5.7 Genetic screening  NIPs low risk, AFP neg Anatomy US WNL  Immunization History  Administered Date(s) Administered   Influenza,inj,Quad PF,6+ Mos 12/21/2012, 12/22/2013   Tdap 12/16/2011    Prenatal Transfer Tool  Maternal Diabetes: No Genetic Screening: Normal Maternal Ultrasounds/Referrals: Normal Fetal Ultrasounds or other Referrals:  None Maternal Substance Abuse:  No Significant Maternal Medications:  None Significant Maternal Lab Results: Group B Strep positive Number of Prenatal Visits:greater than 3 verified prenatal visits Maternal Vaccinations:Covid Other Comments:  None   Results for orders placed or performed during the hospital encounter of 07/20/23 (from the past 24 hours)  Fern Test   Collection Time: 07/20/23  5:51 PM  Result Value Ref Range   POCT Fern Test Positive = ruptured amniotic membanes     Patient Active Problem List   Diagnosis Date Noted   AMA (advanced maternal age) multigravida 35+ 02/08/2023   Obesity affecting pregnancy, antepartum 02/08/2023   Supervision of other normal pregnancy, antepartum 12/09/2022   Tubular adenoma of colon 09/12/2022   Acute pansinusitis 03/13/2020   Asthma 03/13/2020   Chronic headaches 03/02/2013   GERD (gastroesophageal reflux disease) 03/02/2013   ADD (attention deficit disorder) 12/21/2012   Migraine headache 09/26/2009    Assessment/Plan:  EMILENE ROMA is a 38 y.o. G2P1001 at [redacted]w[redacted]d here for SOL and SROM.  She is GBS positive with a known penicillin  allergy. Will try cefazolin per pharmacy and switch to vancomycin if patient is unable to tolerate.  #Labor: SOL with SROM, indeterminate timing, likely around 1800 #Pain: Desires epidural #FWB: Cat I #GBS status:  positive #Feeding: Breastmilk  #Reproductive Life planning: Progesterone only pills #Circ:  not applicable  Gerrit Heck, DO  07/20/2023, 6:20 PM  Attestation of Supervision of Student:  I confirm that I have verified the information documented in the resident's note and that I have also personally reperformed the history, physical exam and all medical decision making activities.  I have verified that all services and findings are accurately documented in this student's note; and I agree with management and plan as outlined in the documentation. I have also made any necessary editorial changes.  Patient unsure of rupture time. States she has often urinated on herself from coughing with acid reflux. No fever/chills/signs of chorio at this time. Plan for cervical check after comfortable with epidural. Pit PRN.  Joanne Gavel, MD OB Fellow 07/20/2023 10:29 PM

## 2023-07-21 ENCOUNTER — Encounter (HOSPITAL_COMMUNITY): Payer: Self-pay | Admitting: Obstetrics and Gynecology

## 2023-07-21 ENCOUNTER — Other Ambulatory Visit: Payer: Self-pay

## 2023-07-21 DIAGNOSIS — O480 Post-term pregnancy: Secondary | ICD-10-CM

## 2023-07-21 DIAGNOSIS — O99214 Obesity complicating childbirth: Secondary | ICD-10-CM

## 2023-07-21 DIAGNOSIS — Z3A40 40 weeks gestation of pregnancy: Secondary | ICD-10-CM

## 2023-07-21 DIAGNOSIS — O99820 Streptococcus B carrier state complicating pregnancy: Secondary | ICD-10-CM

## 2023-07-21 DIAGNOSIS — O09523 Supervision of elderly multigravida, third trimester: Secondary | ICD-10-CM

## 2023-07-21 DIAGNOSIS — O4202 Full-term premature rupture of membranes, onset of labor within 24 hours of rupture: Secondary | ICD-10-CM

## 2023-07-21 LAB — RPR: RPR Ser Ql: NONREACTIVE

## 2023-07-21 MED ORDER — SIMETHICONE 80 MG PO CHEW: 80.0000 mg | CHEWABLE_TABLET | ORAL | Status: DC | PRN

## 2023-07-21 MED ORDER — MEDROXYPROGESTERONE ACETATE 150 MG/ML IM SUSP: 150.0000 mg | INTRAMUSCULAR | Status: DC | PRN

## 2023-07-21 MED ORDER — ZOLPIDEM TARTRATE 5 MG PO TABS: 5.0000 mg | ORAL_TABLET | Freq: Every evening | ORAL | Status: DC | PRN

## 2023-07-21 MED ORDER — BENZOCAINE-MENTHOL 20-0.5 % EX AERO
1.0000 | INHALATION_SPRAY | CUTANEOUS | Status: DC | PRN
  Filled 2023-07-21: qty 56

## 2023-07-21 MED ORDER — DIBUCAINE (PERIANAL) 1 % EX OINT: 1.0000 | TOPICAL_OINTMENT | CUTANEOUS | Status: DC | PRN

## 2023-07-21 MED ORDER — ACETAMINOPHEN 500 MG PO TABS
1000.0000 mg | ORAL_TABLET | Freq: Three times a day (TID) | ORAL | Status: DC
  Administered 2023-07-21 – 2023-07-22 (×4): 1000 mg via ORAL
  Filled 2023-07-21 (×4): qty 2

## 2023-07-21 MED ORDER — OXYCODONE HCL 5 MG PO TABS: 10.0000 mg | ORAL_TABLET | Freq: Four times a day (QID) | ORAL | Status: DC | PRN

## 2023-07-21 MED ORDER — ONDANSETRON HCL 4 MG/2ML IJ SOLN: 4.0000 mg | INTRAMUSCULAR | Status: DC | PRN

## 2023-07-21 MED ORDER — IBUPROFEN 800 MG PO TABS
800.0000 mg | ORAL_TABLET | Freq: Three times a day (TID) | ORAL | Status: DC
  Administered 2023-07-21 – 2023-07-22 (×4): 800 mg via ORAL
  Filled 2023-07-21 (×4): qty 1

## 2023-07-21 MED ORDER — TERBUTALINE SULFATE 1 MG/ML IJ SOLN: 0.2500 mg | Freq: Once | INTRAMUSCULAR | Status: DC | PRN

## 2023-07-21 MED ORDER — PRENATAL MULTIVITAMIN CH
1.0000 | ORAL_TABLET | Freq: Every day | ORAL | Status: DC
  Administered 2023-07-21: 1 via ORAL
  Filled 2023-07-21: qty 1

## 2023-07-21 MED ORDER — COCONUT OIL OIL
1.0000 | TOPICAL_OIL | Status: DC | PRN
  Administered 2023-07-21: 1 via TOPICAL

## 2023-07-21 MED ORDER — CALCIUM CARBONATE ANTACID 500 MG PO CHEW
400.0000 mg | CHEWABLE_TABLET | Freq: Four times a day (QID) | ORAL | Status: DC | PRN
  Administered 2023-07-21: 400 mg via ORAL
  Filled 2023-07-21: qty 2

## 2023-07-21 MED ORDER — DIPHENHYDRAMINE HCL 25 MG PO CAPS: 25.0000 mg | ORAL_CAPSULE | Freq: Four times a day (QID) | ORAL | Status: DC | PRN

## 2023-07-21 MED ORDER — OXYTOCIN-SODIUM CHLORIDE 30-0.9 UT/500ML-% IV SOLN: 1.0000 m[IU]/min | INTRAVENOUS | Status: DC

## 2023-07-21 MED ORDER — OXYCODONE HCL 5 MG PO TABS: 5.0000 mg | ORAL_TABLET | Freq: Four times a day (QID) | ORAL | Status: DC | PRN

## 2023-07-21 MED ORDER — SENNOSIDES-DOCUSATE SODIUM 8.6-50 MG PO TABS: 2.0000 | ORAL_TABLET | Freq: Every day | ORAL | Status: DC

## 2023-07-21 MED ORDER — ONDANSETRON HCL 4 MG PO TABS: 4.0000 mg | ORAL_TABLET | ORAL | Status: DC | PRN

## 2023-07-21 MED ORDER — WITCH HAZEL-GLYCERIN EX PADS: 1.0000 | MEDICATED_PAD | CUTANEOUS | Status: DC | PRN

## 2023-07-21 NOTE — Discharge Instructions (Signed)
 WHAT TO LOOK OUT FOR: Fever of 100.4 or above Mastitis: feels like flu and breasts hurt Infection: increased pain, swelling or redness Blood clots golf ball size or larger Postpartum depression   Congratulations on your newest addition!

## 2023-07-21 NOTE — Plan of Care (Signed)

## 2023-07-21 NOTE — Discharge Summary (Signed)
 Postpartum Discharge Summary  Date of Service updated***     Patient Name: Erica Wyatt DOB: 04/16/1985 MRN: [id number]  Date of admission: 07/20/2023 Delivery date:07/21/2023 Delivering provider: Joanne Gavel Date of discharge: 07/21/2023  Admitting diagnosis: Encounter for induction of labor [Z34.90] Intrauterine pregnancy: [redacted]w[redacted]d     Secondary diagnosis:  Principal Problem:   NSVD (normal spontaneous vaginal delivery) Active Problems:   Migraine headache   ADD (attention deficit disorder)   GERD (gastroesophageal reflux disease)   Asthma   Tubular adenoma of colon   Supervision of other normal pregnancy, antepartum   AMA (advanced maternal age) multigravida 35+   Obesity affecting pregnancy, antepartum  Additional problems: ***    Discharge diagnosis: Term Pregnancy Delivered                                              Post partum procedures:{Postpartum procedures:23558} Augmentation: N/A Complications: None  Hospital course: Onset of Labor With Vaginal Delivery      38 y.o. yo G2P1001 at [redacted]w[redacted]d was admitted in Latent Labor on 07/20/2023 for SROM. Labor course was complicated by none.  Membrane Rupture Time/Date: 5:12 PM,07/20/2023  Delivery Method:Vaginal, Spontaneous Operative Delivery:N/A Episiotomy: None Lacerations:    Patient had a postpartum course complicated by ***.  She is ambulating, tolerating a regular diet, passing flatus, and urinating well. Patient is discharged home in stable condition on 07/21/23.  Newborn Data: Birth date:07/21/2023 Birth time:2:15 AM Gender:Female Living status:Living Apgars:9 ,9  Weight:   Magnesium Sulfate received: No BMZ received: No Rhophylac:No MMR:No T-DaP:Given prenatally Flu: Yes RSV Vaccine received: Yes Transfusion:{Transfusion received:30440034}  Immunizations received: Immunization History  Administered Date(s) Administered   Influenza,inj,Quad PF,6+ Mos 12/21/2012, 12/22/2013   Tdap 12/16/2011     Physical exam  Vitals:   07/20/23 2300 07/20/23 2330 07/21/23 0000 07/21/23 0030  BP: 104/63 121/75 (!) 107/51 112/61  Pulse: 87 80 66 74  Resp: 16 16 16 18   Temp:  98.3 F (36.8 C)    TempSrc:  Oral    SpO2:      Weight:      Height:       General: {Exam; general:21111117} Lochia: {Desc; appropriate/inappropriate:30686::"appropriate"} Uterine Fundus: {Desc; firm/soft:30687} Incision: {Exam; incision:21111123} DVT Evaluation: {Exam; dvt:2111122} Labs: Lab Results  Component Value Date   WBC 9.2 07/20/2023   HGB 10.2 (L) 07/20/2023   HCT 30.6 (L) 07/20/2023   MCV 88.7 07/20/2023   PLT 119 (L) 07/20/2023      Latest Ref Rng & Units 08/19/2022   11:37 AM  CMP  Glucose 70 - 99 mg/dL 85   BUN 6 - 20 mg/dL 13   Creatinine 2.95 - 1.00 mg/dL 6.21   Sodium 308 - 657 mmol/L 138   Potassium 3.5 - 5.1 mmol/L 3.9   Chloride 98 - 111 mmol/L 106   CO2 22 - 32 mmol/L 26   Calcium 8.9 - 10.3 mg/dL 8.9   Total Protein 6.5 - 8.1 g/dL 7.0   Total Bilirubin 0.3 - 1.2 mg/dL 0.7   Alkaline Phos 38 - 126 U/L 32   AST 15 - 41 U/L 18   ALT 0 - 44 U/L 15    Edinburgh Score:     No data to display         No data recorded  After visit meds:  Allergies as of  07/21/2023       Reactions   Latex Rash, Other (See Comments)   Irritation to mucous membranes, especially vaginal tissue   Amoxicillin Swelling, Other (See Comments)   Childhood reaction   Penicillins Swelling, Other (See Comments)   Childhood reaction   Topiramate Other (See Comments)   Suicidal thoughts   Sulfa Antibiotics Swelling, Rash     Med Rec must be completed prior to using this SMARTLINK***        Discharge home in stable condition Infant Feeding: {Baby feeding:23562} Infant Disposition:{CHL IP OB HOME WITH WUJWJX:91478} Discharge instruction: per After Visit Summary and Postpartum booklet. Activity: Advance as tolerated. Pelvic rest for 6 weeks.  Diet: {OB GNFA:21308657} Future  Appointments: Future Appointments  Date Time Provider Department Center  07/22/2023  1:10 PM [facility] None   Follow up Visit:  [facility] 4/9 [facility]  Please schedule this patient for a In person postpartum visit in 6 weeks with the following provider: Any provider. Additional Postpartum F/U: None   Low risk pregnancy complicated by:  none Delivery mode:  Vaginal, Spontaneous Anticipated Birth Control:  Unsure   07/21/2023 Joanne Gavel, MD

## 2023-07-21 NOTE — Anesthesia Postprocedure Evaluation (Signed)
 Anesthesia Post Note  Patient: Erica Wyatt  Procedure(s) Performed: AN AD HOC LABOR EPIDURAL     Patient location during evaluation: Mother Baby Anesthesia Type: Epidural Level of consciousness: awake and alert Pain management: pain level controlled Vital Signs Assessment: post-procedure vital signs reviewed and stable Respiratory status: spontaneous breathing, nonlabored ventilation and respiratory function stable Cardiovascular status: stable Postop Assessment: no headache, no backache and epidural receding Anesthetic complications: no   No notable events documented.  Last Vitals:  Vitals:   07/21/23 0433 07/21/23 0532  BP: 108/65 116/72  Pulse: 67 74  Resp: 16 16  Temp: 36.9 C 36.7 C  SpO2: 99% 99%    Last Pain:  Vitals:   07/21/23 0532  TempSrc: Oral  PainSc: 0-No pain   Pain Goal:                   [facility]

## 2023-07-21 NOTE — Lactation Note (Signed)
 This note was copied from a baby's chart. Lactation Consultation Note  Patient Name: Erica Wyatt [id number] Date: 07/21/2023 Age:39 hours  Reason for consult: Initial assessment;Term  P2, [redacted]w[redacted]d  Initial LC visit to mother and baby Erica "Erica Wyatt". Mother reports baby has been breast feeding very well since birth. She has been breastfeeding since 1015, latching on and off, over the last 2 hours. Currently baby is sleeping. Encouraged feeding with cues, watching for active feeding and placing her skin to skin.   Mother has pumped some prior to delivery. She has been able to observe colostrum. Mother receptive to learning hand expression. Other has full breast and erect nipple. Colostrum was not observed at this time and encouraged mother to keep trying before and after a feeding to express colostrum.   Manual hand pump provided for pre-pumping, breast stimulation and at mother's request. Fitted with a 21 mm flange. Manual breast pump given and instructed mother on the use, frequency, cleaning of breast pump and storage of breast milk.  CDC guidelines for milk storage, preparation and cleaning of breast pump parts provided in Spanish. Coconut oil given with instructions.   Mom made aware of O/P services, breastfeeding support groups, community resources, and our phone # for post-discharge questions.     Maternal Data Has patient been taught Hand Expression?: Yes Does the patient have breastfeeding experience prior to this delivery?: Yes How long did the patient breastfeed?: "tried briefly with her son 11 years ago, milk came in and she experienced engorgement"  Feeding Mother's Current Feeding Choice: Breast Milk  Lactation Tools Discussed/Used  Manual pump  Interventions Interventions: Breast feeding basics reviewed;Hand express;Coconut oil;Hand pump;Education;CDC milk storage guidelines;CDC Guidelines for Breast Pump Cleaning  Discharge Discharge Education: Outpatient recommendation  (Mother states she has a LC that she plans to see after discharge. Ped's office and [facility] also offer OP LC appointments and support) Pump: Manual;Hands Free;Personal;DEBP (Motif, Mom Cozy, given manual pump at mother's request)  Consult Status Consult Status: Follow-up Date: 07/22/23 Follow-up type: In-patient    Christella Hartigan M 07/21/2023, 1:08 PM

## 2023-07-22 ENCOUNTER — Encounter

## 2023-07-22 MED ORDER — IBUPROFEN 800 MG PO TABS: 800.0000 mg | ORAL_TABLET | Freq: Three times a day (TID) | ORAL | 0 refills | Status: AC | PRN

## 2023-07-22 NOTE — Plan of Care (Signed)
 Problem: Education: Goal: Knowledge of General Education information will improve Description: Including pain rating scale, medication(s)/side effects and non-pharmacologic comfort measures 07/22/2023 0806 by Donne Hazel, LPN Outcome: Adequate for Discharge 07/22/2023 0723 by Donne Hazel, LPN Outcome: Progressing   Problem: Health Behavior/Discharge Planning: Goal: Ability to manage health-related needs will improve 07/22/2023 0806 by Donne Hazel, LPN Outcome: Adequate for Discharge 07/22/2023 0723 by Donne Hazel, LPN Outcome: Progressing   Problem: Clinical Measurements: Goal: Ability to maintain clinical measurements within normal limits will improve 07/22/2023 0806 by Donne Hazel, LPN Outcome: Adequate for Discharge 07/22/2023 0723 by Donne Hazel, LPN Outcome: Progressing Goal: Will remain free from infection 07/22/2023 0806 by Donne Hazel, LPN Outcome: Adequate for Discharge 07/22/2023 0723 by Donne Hazel, LPN Outcome: Progressing Goal: Diagnostic test results will improve 07/22/2023 0806 by Donne Hazel, LPN Outcome: Adequate for Discharge 07/22/2023 0723 by Donne Hazel, LPN Outcome: Progressing Goal: Respiratory complications will improve 07/22/2023 0806 by Donne Hazel, LPN Outcome: Adequate for Discharge 07/22/2023 0723 by Donne Hazel, LPN Outcome: Progressing Goal: Cardiovascular complication will be avoided 07/22/2023 0806 by Donne Hazel, LPN Outcome: Adequate for Discharge 07/22/2023 0723 by Donne Hazel, LPN Outcome: Progressing   Problem: Activity: Goal: Risk for activity intolerance will decrease 07/22/2023 0806 by Donne Hazel, LPN Outcome: Adequate for Discharge 07/22/2023 0723 by Donne Hazel, LPN Outcome: Progressing   Problem: Nutrition: Goal: Adequate nutrition will be maintained 07/22/2023 0806 by Donne Hazel, LPN Outcome: Adequate for Discharge 07/22/2023 0723 by Donne Hazel, LPN Outcome: Progressing    Problem: Coping: Goal: Level of anxiety will decrease 07/22/2023 0806 by Donne Hazel, LPN Outcome: Adequate for Discharge 07/22/2023 0723 by Donne Hazel, LPN Outcome: Progressing   Problem: Elimination: Goal: Will not experience complications related to bowel motility 07/22/2023 0806 by Donne Hazel, LPN Outcome: Adequate for Discharge 07/22/2023 0723 by Donne Hazel, LPN Outcome: Progressing Goal: Will not experience complications related to urinary retention 07/22/2023 0806 by Donne Hazel, LPN Outcome: Adequate for Discharge 07/22/2023 0723 by Donne Hazel, LPN Outcome: Progressing   Problem: Pain Managment: Goal: General experience of comfort will improve and/or be controlled 07/22/2023 0806 by Donne Hazel, LPN Outcome: Adequate for Discharge 07/22/2023 0723 by Donne Hazel, LPN Outcome: Progressing   Problem: Safety: Goal: Ability to remain free from injury will improve 07/22/2023 0806 by Donne Hazel, LPN Outcome: Adequate for Discharge 07/22/2023 0723 by Donne Hazel, LPN Outcome: Progressing   Problem: Skin Integrity: Goal: Risk for impaired skin integrity will decrease 07/22/2023 0806 by Donne Hazel, LPN Outcome: Adequate for Discharge 07/22/2023 0723 by Donne Hazel, LPN Outcome: Progressing   Problem: Education: Goal: Knowledge of condition will improve 07/22/2023 0806 by Donne Hazel, LPN Outcome: Adequate for Discharge 07/22/2023 0723 by Donne Hazel, LPN Outcome: Progressing Goal: Individualized Educational Video(s) 07/22/2023 0806 by Donne Hazel, LPN Outcome: Adequate for Discharge 07/22/2023 0723 by Donne Hazel, LPN Outcome: Progressing Goal: Individualized Newborn Educational Video(s) 07/22/2023 0806 by Donne Hazel, LPN Outcome: Adequate for Discharge 07/22/2023 0723 by Donne Hazel, LPN Outcome: Progressing   Problem: Activity: Goal: Will verbalize the importance of balancing activity with adequate rest  periods 07/22/2023 0806 by Donne Hazel, LPN Outcome: Adequate for Discharge 07/22/2023 0723 by Donne Hazel, LPN Outcome: Progressing Goal: Ability to tolerate increased activity will improve 07/22/2023 0806 by Donne Hazel, LPN  Outcome: Adequate for Discharge 07/22/2023 [id number] by Donne Hazel, LPN Outcome: Progressing   Problem: Coping: Goal: Ability to identify and utilize available resources and services will improve 07/22/2023 0806 by Donne Hazel, LPN Outcome: Adequate for Discharge 07/22/2023 0723 by Donne Hazel, LPN Outcome: Progressing   Problem: Life Cycle: Goal: Chance of risk for complications during the postpartum period will decrease 07/22/2023 0806 by Donne Hazel, LPN Outcome: Adequate for Discharge 07/22/2023 0723 by Donne Hazel, LPN Outcome: Progressing   Problem: Role Relationship: Goal: Ability to demonstrate positive interaction with newborn will improve 07/22/2023 0806 by Donne Hazel, LPN Outcome: Adequate for Discharge 07/22/2023 0723 by Donne Hazel, LPN Outcome: Progressing   Problem: Skin Integrity: Goal: Demonstration of wound healing without infection will improve 07/22/2023 0806 by Donne Hazel, LPN Outcome: Adequate for Discharge 07/22/2023 0723 by Donne Hazel, LPN Outcome: Progressing

## 2023-07-22 NOTE — Lactation Note (Signed)
 This note was copied from a baby's chart. Lactation Consultation Note  Patient Name: Erica Wyatt [id number] Date: 07/22/2023 Age:39 hours  Reason for consult: Follow-up assessment;Term  P2, [redacted]w[redacted]d, 4.6% weight loss  Follow up LC visit. Mother had baby latched and infant rhythmically sucking with occasional swallow. Mother states baby cluster fed last night over 4 hours and continues to feed frequently today. Mother reports her breast became larger during pregnancy. Discussed signs that baby is feeding well by stools, wets, milk volume increasing, swallows and breast softening once milk volume is present and weight checks.  Mother had questions regarding diet with breastfeeding and pumping when returning to work. Questions discussed and answered.   Mom made aware of O/P services, breastfeeding support groups, and our phone # for post-discharge questions.     LATCH Score Latch: Grasps breast easily, tongue down, lips flanged, rhythmical sucking.  Audible Swallowing: A few with stimulation  Type of Nipple: Everted at rest and after stimulation  Comfort (Breast/Nipple): Soft / non-tender  Hold (Positioning): No assistance needed to correctly position infant at breast.  LATCH Score: 9   Lactation Tools Discussed/Used Tools: Pump;Flanges Flange Size: 21 Breast pump type: Double-Electric Breast Pump Reason for Pumping: mother requested to pump, RN set up Pumping frequency: mother pumped once last night Pumped volume: 0 mL  Interventions Interventions: Education  Discharge Discharge Education: Engorgement and breast care;Warning signs for feeding baby;Outpatient recommendation (discussed the benefits of OP LC appointment and support) Pump: Manual;Hands Free;Personal  Consult Status Consult Status: Complete Date: 07/22/23    Omar Person 07/22/2023, 12:09 PM

## 2023-07-22 NOTE — Plan of Care (Signed)

## 2023-07-26 ENCOUNTER — Inpatient Hospital Stay (HOSPITAL_COMMUNITY)

## 2023-07-26 ENCOUNTER — Inpatient Hospital Stay (HOSPITAL_COMMUNITY): Admission: RE | Admit: 2023-07-26 | Source: Home / Self Care | Admitting: Obstetrics and Gynecology

## 2023-08-02 ENCOUNTER — Telehealth (HOSPITAL_COMMUNITY): Payer: Self-pay

## 2023-08-02 NOTE — Telephone Encounter (Signed)
 08/02/2023 1112  Name: Erica Wyatt MRN: [id number] DOB: 1985-12-15  Reason for Call:  Transition of Care Hospital Discharge Call  Contact Status: Patient Contact Status: Message  Language assistant needed:          Follow-Up Questions:    Dimple Francis Postnatal Depression Scale:  In the Past 7 Days:    PHQ2-9 Depression Scale:     Discharge Follow-up:    Post-discharge interventions: NA  Signature  Wadell Guild

## 2023-08-25 ENCOUNTER — Other Ambulatory Visit (HOSPITAL_COMMUNITY)
Admission: RE | Admit: 2023-08-25 | Discharge: 2023-08-25 | Disposition: A | Discharge status: Home / Self Care | Source: Ambulatory Visit

## 2023-08-25 ENCOUNTER — Ambulatory Visit

## 2023-08-25 DIAGNOSIS — Z803 Family history of malignant neoplasm of breast: Secondary | ICD-10-CM | POA: Diagnosis not present

## 2023-08-25 DIAGNOSIS — N898 Other specified noninflammatory disorders of vagina: Secondary | ICD-10-CM | POA: Insufficient documentation

## 2023-08-25 DIAGNOSIS — Z3009 Encounter for other general counseling and advice on contraception: Secondary | ICD-10-CM

## 2023-08-25 MED ORDER — NORETHINDRONE 0.35 MG PO TABS: 1.0000 | ORAL_TABLET | Freq: Every day | ORAL | 3 refills | Status: AC

## 2023-08-25 NOTE — Progress Notes (Signed)
 Post Partum Visit Note  Erica Wyatt is a 38 y.o. G62P2002 female who presents for a postpartum visit. She is 5 weeks postpartum following a normal spontaneous vaginal delivery.  I have fully reviewed the prenatal and intrapartum course. The delivery was at 40 gestational weeks.  Anesthesia: epidural. Postpartum course has been good. Baby is doing well yes. Baby is feeding by breast. Bleeding staining only. Bowel function is constipation. Bladder function is normal. Patient is not sexually active. Contraception method is abstinence. Postpartum depression screening: negative.   The pregnancy intention screening data noted above was reviewed. Potential methods of contraception were discussed. The patient elected to proceed with No data recorded.  Patient reports she receives assistance from SO, mother, and church family.  She denies feelings of depression. She reports sexual activity last week that was with some discomfort. None since.  No condom usage.    Edinburgh Postnatal Depression Scale - 08/25/23 1056       Edinburgh Postnatal Depression Scale:  In the Past 7 Days   I have been able to laugh and see the funny side of things. 0    I have looked forward with enjoyment to things. 0    I have blamed myself unnecessarily when things went wrong. 0    I have been anxious or worried for no good reason. 0    I have felt scared or panicky for no good reason. 0    Things have been getting on top of me. 0    I have been so unhappy that I have had difficulty sleeping. 0    I have felt sad or miserable. 0    I have been so unhappy that I have been crying. 0    The thought of harming myself has occurred to me. 0    Edinburgh Postnatal Depression Scale Total 0              Health Maintenance Due  Topic Date Due   Pneumococcal Vaccine 80-11 Years old (1 of 2 - PCV) Never done   MAMMOGRAM  08/31/2021   DTaP/Tdap/Td (2 - Td or Tdap) 12/15/2021   COVID-19 Vaccine (1 - 2024-25 season)  Never done    The following portions of the patient's history were reviewed and updated as appropriate: allergies, current medications, past family history, past medical history, past social history, past surgical history, and problem list.  Review of Systems Pertinent items are noted in HPI.  Objective:  LMP 10/12/2022    General:  alert, cooperative, and no distress   Breasts:  normal  Lungs: clear to auscultation bilaterally  Heart:  regular rate and rhythm  Abdomen: normal findings: bowel sounds normal   Wound None  GU exam:  not indicated       Assessment:   5 week postpartum exam.  Breastfeeding Normal Involution Vaginal Odor Family H/O Breast Cancer  Plan:    -Congratulations given. -Reviewed sexual activity after delivery and with breastfeeding. Encouraged usage of lubrication. -Okay to return to work when desired. Given OOW note until July 2025. -Discussed family h/o breast cancer.  Mother diagnosed at age 62 and again in Oct 2024.   -Order for mammogram placed.  Patient understands breastfeeding may delay or result in need for additional testing.  -Discussed c/o vaginal odor.  CV collected. Will treat accordingly.  -RTO yearly for annual exam or as needed.  -Problem list reviewed and resolution of problems as appropriate.   Essential components of care  per ACOG recommendations:  1.  Mood and well being: Patient with negative depression screening today. Reviewed local resources for support.  - Patient tobacco use? No.   - hx of drug use? No.    2. Infant care and feeding:  -Patient currently breastmilk feeding? Yes. Reviewed importance of draining breast regularly to support lactation.  -Social determinants of health (SDOH) reviewed in EPIC. No concerns.  3. Sexuality, contraception and birth spacing - Patient is unsure if she desires a pregnancy in the next year.  Desired family size is 3 children.  - Reviewed reproductive life planning. Reviewed  contraceptive methods based on pt preferences and effectiveness.  Patient desired Oral Contraceptive today.   - Discussed birth spacing of 18 months  4. Sleep and fatigue -Encouraged family/partner/community support of 4 hrs of uninterrupted sleep to help with mood and fatigue  5. Physical Recovery  - Discussed patients delivery and complications. She describes her labor as good. - Patient had a Vaginal, no problems at delivery. Patient had no laceration. Perineal healing reviewed. Patient expressed understanding - Patient has urinary incontinence? No. - Patient is safe to resume physical and sexual activity  6.  Health Maintenance - HM due items addressed Yes - Last pap smear  Diagnosis  Date Value Ref Range Status  05/25/2022   Final   - Negative for intraepithelial lesion or malignancy (NILM)   Pap smear not done at today's visit.  -Breast Cancer screening indicated? Yes. Patient referred today for mammogram.   7. Chronic Disease/Pregnancy Condition follow up: None  - PCP follow up  Erica Amy, RN [facility], [facility]    Erica Peru  MSN, CNM Advanced Practice Provider, [facility]

## 2023-08-25 NOTE — Progress Notes (Signed)
 Experiencing ammonia smell from vagina. Concerned for BV.Erica Wyatt  Interested in birth control.

## 2023-08-26 LAB — CERVICOVAGINAL ANCILLARY ONLY
Bacterial Vaginitis (gardnerella): POSITIVE — AB
Candida Glabrata: NEGATIVE
Candida Vaginitis: NEGATIVE
Comment: NEGATIVE
Comment: NEGATIVE
Comment: NEGATIVE

## 2023-08-27 ENCOUNTER — Ambulatory Visit: Payer: Self-pay

## 2023-08-27 MED ORDER — METRONIDAZOLE 0.75 % VA GEL: 1.0000 | Freq: Every day | VAGINAL | 1 refills | Status: AC
